# Patient Record
Sex: Female | Born: 1945 | Race: Black or African American | Hispanic: No | Smoking: Current every day smoker
Health system: Southern US, Community
[De-identification: ages and names within clinical notes are randomized; demographics above are authoritative.]

## PROBLEM LIST (undated history)

## (undated) DIAGNOSIS — M778 Other enthesopathies, not elsewhere classified: Secondary | ICD-10-CM

## (undated) DIAGNOSIS — K219 Gastro-esophageal reflux disease without esophagitis: Secondary | ICD-10-CM

## (undated) DIAGNOSIS — N200 Calculus of kidney: Secondary | ICD-10-CM

## (undated) HISTORY — DX: Gastro-esophageal reflux disease without esophagitis: K21.9

## (undated) HISTORY — DX: Other enthesopathies, not elsewhere classified: M77.8

## (undated) HISTORY — DX: Calculus of kidney: N20.0

---

## 1998-09-04 ENCOUNTER — Emergency Department (HOSPITAL_COMMUNITY): Admission: EM | Admit: 1998-09-04 | Discharge: 1998-09-04 | Payer: Self-pay | Admitting: Emergency Medicine

## 2000-02-19 ENCOUNTER — Encounter: Payer: Self-pay | Admitting: Emergency Medicine

## 2000-02-19 ENCOUNTER — Emergency Department (HOSPITAL_COMMUNITY): Admission: EM | Admit: 2000-02-19 | Discharge: 2000-02-19 | Payer: Self-pay | Admitting: Emergency Medicine

## 2001-04-12 ENCOUNTER — Emergency Department (HOSPITAL_COMMUNITY): Admission: EM | Admit: 2001-04-12 | Discharge: 2001-04-12 | Payer: Self-pay | Admitting: Emergency Medicine

## 2001-04-15 ENCOUNTER — Inpatient Hospital Stay (HOSPITAL_COMMUNITY): Admission: EM | Admit: 2001-04-15 | Discharge: 2001-04-21 | Payer: Self-pay | Admitting: *Deleted

## 2001-04-15 ENCOUNTER — Encounter (INDEPENDENT_AMBULATORY_CARE_PROVIDER_SITE_OTHER): Payer: Self-pay | Admitting: Specialist

## 2001-04-17 ENCOUNTER — Encounter: Payer: Self-pay | Admitting: Emergency Medicine

## 2001-04-18 ENCOUNTER — Encounter: Payer: Self-pay | Admitting: General Surgery

## 2001-04-19 ENCOUNTER — Encounter: Payer: Self-pay | Admitting: Emergency Medicine

## 2001-08-03 ENCOUNTER — Encounter (INDEPENDENT_AMBULATORY_CARE_PROVIDER_SITE_OTHER): Payer: Self-pay | Admitting: *Deleted

## 2001-08-03 ENCOUNTER — Encounter: Payer: Self-pay | Admitting: General Surgery

## 2001-08-03 ENCOUNTER — Ambulatory Visit (HOSPITAL_COMMUNITY): Admission: RE | Admit: 2001-08-03 | Discharge: 2001-08-05 | Payer: Self-pay | Admitting: General Surgery

## 2002-07-18 ENCOUNTER — Other Ambulatory Visit: Admission: RE | Admit: 2002-07-18 | Discharge: 2002-07-18 | Payer: Self-pay | Admitting: Family Medicine

## 2002-08-05 ENCOUNTER — Encounter: Payer: Self-pay | Admitting: Family Medicine

## 2002-08-05 ENCOUNTER — Encounter: Admission: RE | Admit: 2002-08-05 | Discharge: 2002-08-05 | Payer: Self-pay | Admitting: Family Medicine

## 2003-10-27 ENCOUNTER — Other Ambulatory Visit: Admission: RE | Admit: 2003-10-27 | Discharge: 2003-10-27 | Payer: Self-pay | Admitting: Family Medicine

## 2006-03-03 ENCOUNTER — Ambulatory Visit: Payer: Self-pay | Admitting: Family Medicine

## 2006-03-10 ENCOUNTER — Ambulatory Visit: Payer: Self-pay | Admitting: Family Medicine

## 2007-05-14 ENCOUNTER — Telehealth: Payer: Self-pay | Admitting: Family Medicine

## 2008-03-10 ENCOUNTER — Ambulatory Visit: Payer: Self-pay | Admitting: Internal Medicine

## 2008-03-10 DIAGNOSIS — M65849 Other synovitis and tenosynovitis, unspecified hand: Secondary | ICD-10-CM

## 2008-03-10 DIAGNOSIS — M65839 Other synovitis and tenosynovitis, unspecified forearm: Secondary | ICD-10-CM | POA: Insufficient documentation

## 2008-05-22 ENCOUNTER — Encounter: Payer: Self-pay | Admitting: Internal Medicine

## 2008-10-23 ENCOUNTER — Ambulatory Visit: Payer: Self-pay | Admitting: Gastroenterology

## 2008-12-01 ENCOUNTER — Ambulatory Visit: Payer: Self-pay | Admitting: Gastroenterology

## 2009-09-03 ENCOUNTER — Encounter: Payer: Self-pay | Admitting: *Deleted

## 2009-10-23 ENCOUNTER — Ambulatory Visit: Payer: Self-pay | Admitting: Family Medicine

## 2009-10-23 DIAGNOSIS — Z87442 Personal history of urinary calculi: Secondary | ICD-10-CM | POA: Insufficient documentation

## 2009-10-23 DIAGNOSIS — F17200 Nicotine dependence, unspecified, uncomplicated: Secondary | ICD-10-CM | POA: Insufficient documentation

## 2009-10-23 DIAGNOSIS — K219 Gastro-esophageal reflux disease without esophagitis: Secondary | ICD-10-CM | POA: Insufficient documentation

## 2010-09-12 LAB — CONVERTED CEMR LAB
ALT: 26 units/L (ref 0–35)
AST: 24 units/L (ref 0–37)
Albumin: 4.1 g/dL (ref 3.5–5.2)
Alkaline Phosphatase: 97 units/L (ref 39–117)
BUN: 11 mg/dL (ref 6–23)
Basophils Absolute: 0.1 10*3/uL (ref 0.0–0.1)
Basophils Relative: 1.6 % (ref 0.0–3.0)
Bilirubin Urine: NEGATIVE
Bilirubin, Direct: 0.1 mg/dL (ref 0.0–0.3)
CO2: 29 meq/L (ref 19–32)
Calcium: 8.9 mg/dL (ref 8.4–10.5)
Chloride: 108 meq/L (ref 96–112)
Cholesterol: 150 mg/dL (ref 0–200)
Creatinine, Ser: 1 mg/dL (ref 0.4–1.2)
Eosinophils Absolute: 0.2 10*3/uL (ref 0.0–0.7)
Eosinophils Relative: 2.6 % (ref 0.0–5.0)
GFR calc non Af Amer: 72.27 mL/min (ref >60)
Glucose, Bld: 93 mg/dL (ref 70–99)
Glucose, Urine, Semiquant: NEGATIVE
HCT: 36.6 % (ref 36.0–46.0)
HDL: 51 mg/dL (ref >39.00)
Hemoglobin: 12 g/dL (ref 12.0–15.0)
Ketones, urine, test strip: NEGATIVE
LDL Cholesterol: 77 mg/dL (ref 0–99)
Lymphocytes Relative: 57.6 % — ABNORMAL HIGH (ref 12.0–46.0)
Lymphs Abs: 3.9 10*3/uL (ref 0.7–4.0)
MCHC: 32.9 g/dL (ref 30.0–36.0)
MCV: 101.8 fL — ABNORMAL HIGH (ref 78.0–100.0)
Monocytes Absolute: 0.5 10*3/uL (ref 0.1–1.0)
Monocytes Relative: 8 % (ref 3.0–12.0)
Neutro Abs: 2 10*3/uL (ref 1.4–7.7)
Neutrophils Relative %: 30.2 % — ABNORMAL LOW (ref 43.0–77.0)
Nitrite: NEGATIVE
Platelets: 209 10*3/uL (ref 150.0–400.0)
Potassium: 3.7 meq/L (ref 3.5–5.1)
Protein, U semiquant: NEGATIVE
RBC: 3.59 M/uL — ABNORMAL LOW (ref 3.87–5.11)
RDW: 12.7 % (ref 11.5–14.6)
Sodium: 142 meq/L (ref 135–145)
Specific Gravity, Urine: 1.025
TSH: 1.55 microintl units/mL (ref 0.35–5.50)
Total Bilirubin: 0.4 mg/dL (ref 0.3–1.2)
Total CHOL/HDL Ratio: 3
Total Protein: 7.3 g/dL (ref 6.0–8.3)
Triglycerides: 110 mg/dL (ref 0.0–149.0)
Urobilinogen, UA: 0.2
VLDL: 22 mg/dL (ref 0.0–40.0)
WBC Urine, dipstick: NEGATIVE
WBC: 6.7 10*3/uL (ref 4.5–10.5)
pH: 6

## 2010-09-14 NOTE — Assessment & Plan Note (Signed)
Summary: PT RE-EST // RS----PT [facility] // RS   Vital Signs:  Patient profile:   65 year old female Menstrual status:  postmenopausal Height:      64 inches Weight:      123 pounds BMI:     21.19 Temp:     98.7 degrees F oral BP sitting:   150 / 80  (left arm) Cuff size:   regular  Vitals Entered By: Kern Reap CMA Duncan Dull) (October 23, 2009 2:04 PM)  Reason for Visit re establish and sinus pressure with headache     Menstrual Status postmenopausal   History of Present Illness: Erika Tucker is a 65 year old single female, who comes in today as a new patient for physical evaluation.  She has a history of reflux esophagitis, for which he takes Nexium 40 mg daily.  Will switch to Prilosec.  She gets routine eye care, and she has dentures, does BSE monthly, annual mammography, colonoscopy, October 2010.  Tetanus was two 2003  she continues to smoke..I  insists that she start a smoking cessation program.    She had a Pap smear by her GYN doctor Clearance Coots  Preventive Screening-Counseling & Management  Alcohol-Tobacco     Smoking Status: current     Packs/Day: 0.5  Caffeine-Diet-Exercise     Does Patient Exercise: yes      Drug Use:  no.    Allergies: 1)  ! Acetyl Salicylic Acid (Aspirin)  Past History:  Past Medical History: Nephrolithiasis, hx of GERD  Family History: Reviewed history and no changes required. Family History of CAD Female 1st degree relative <60 Family History of CAD Female 1st degree relative <50 Family History Diabetes 1st degree relative Family History Hypertension  Social History: Reviewed history and no changes required. 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 No apparent alopecia or balding. Eyes:  No corneal or conjunctival inflammation noted. EOMI. Perrla. Funduscopic exam benign, without hemorrhages, exudates or papilledema. Vision grossly normal. Ears:  External ear exam shows no significant lesions or deformities.  Otoscopic examination reveals clear canals, tympanic membranes are intact bilaterally without bulging, retraction, inflammation or discharge. Hearing is grossly normal bilaterally. Nose:  External nasal examination shows no deformity or inflammation. Nasal mucosa are pink and moist without lesions or exudates. Mouth:  Oral mucosa and oropharynx without lesions or exudates.  Teeth in good repair. Neck:  No deformities, masses, or tenderness noted. Chest Wall:  No deformities, masses, or tenderness noted. Breasts:  No mass, nodules, thickening, tenderness, bulging, retraction, inflamation, nipple discharge or skin changes noted.   Lungs:  decreased breath sounds bilaterally, consistent with COPD Heart:  Normal rate and regular rhythm. S1 and S2 normal without gallop, murmur, click, rub or other extra sounds. Abdomen:  Bowel sounds positive,abdomen soft and non-tender without masses, organomegaly or hernias noted. Msk:  No deformity or scoliosis noted of thoracic or lumbar spine.   Pulses:  R and L carotid,radial,femoral,dorsalis pedis and posterior tibial pulses are full and equal bilaterally Extremities:  No clubbing, cyanosis, edema, or deformity noted with normal full range of motion of all joints.   Neurologic:  No cranial nerve deficits noted. Station and gait are normal. Plantar reflexes are down-going bilaterally. DTRs are symmetrical  throughout. Sensory, motor and coordinative functions appear intact. Skin:  Intact without suspicious lesions or rashes Cervical Nodes:  No lymphadenopathy noted Axillary Nodes:  No palpable lymphadenopathy Inguinal Nodes:   No significant adenopathy Psych:  Cognition and judgment appear intact. Alert and cooperative with normal attention span and concentration. No apparent delusions, illusions, hallucinations   Problems:  Medical Problems Added: 1)  Dx of Physical Examination  (ICD-V70.0) 2)  Dx of Tobacco Use  (ICD-305.1) 3)  Dx of Family History Diabetes 1st Degree Relative  (ICD-V18.0) 4)  Dx of Family History of Cad Female 1st Degree Relative <50  (ICD-V17.3) 5)  Dx of Family History of Cad Female 1st Degree Relative <60  (ICD-V16.49) 6)  Dx of Gerd  (ICD-530.81) 7)  Dx of Nephrolithiasis, Hx of  (ICD-V13.01)  Impression & Recommendations:  Problem # 1:  TOBACCO USE (ICD-305.1) Assessment Unchanged  Orders: Venipuncture (16109) TLB-Lipid Panel (80061-LIPID) TLB-BMP (Basic Metabolic Panel-BMET) (80048-METABOL) TLB-CBC Platelet - w/Differential (85025-CBCD) TLB-Hepatic/Liver Function Pnl (80076-HEPATIC) TLB-TSH (Thyroid Stimulating Hormone) (60454-UJW) Prescription Created Electronically [id number]) UA Dipstick w/o Micro (automated)  (81003) T-2 View CXR (71020TC)  Her updated medication list for this problem includes:    Chantix Starting Month Pak 0.5 Mg X 11 & 1 Mg X 42 Tabs (Varenicline tartrate) ..... Uad  Problem # 2:  GERD (ICD-530.81) Assessment: Improved  The following medications were removed from the medication list:    Nexium 40 Mg Cpdr (Esomeprazole magnesium) .Marland Kitchen... 1 once daily Her updated medication list for this problem includes:    Prilosec 40 Mg Cpdr (Omeprazole) .Marland Kitchen... Take 1 tablet by mouth every morning  Orders: Venipuncture (78295) TLB-Lipid Panel (80061-LIPID) TLB-BMP (Basic Metabolic Panel-BMET) (80048-METABOL) TLB-CBC Platelet - w/Differential (85025-CBCD) TLB-Hepatic/Liver Function Pnl (80076-HEPATIC) TLB-TSH (Thyroid Stimulating Hormone) (62130-QMV) Prescription Created Electronically [phone]) UA Dipstick w/o Micro (automated)  (81003)  Problem # 3:  Preventive  Health Care (ICD-V70.0) Assessment: Unchanged  Complete Medication List: 1)  Prilosec 40 Mg Cpdr (Omeprazole) .... Take 1 tablet by mouth every morning 2)  Chantix Starting Month Pak 0.5 Mg X 11 & 1 Mg X 42 Tabs (Varenicline tartrate) .... Uad  Patient Instructions: 1)  you may take the over-the-counter or  the prescription, Prilosec, one daily.  When you stop smoking. u  probably will not need this medication 2)  begin the chantix as directed, and return in two weeks for follow-up. 3)  Go to the main office for a chest x-ray now.  4)  Schedule your mammogram. 5)  Schedule a colonoscopy/sigmoidoscopy to help detect colon cancer. 6)  Take calcium +Vitamin D daily. 7)  Take an Aspirin every day. Prescriptions: CHANTIX STARTING MONTH PAK 0.5 MG X 11 & 1 MG X 42 TABS (VARENICLINE TARTRATE) UAD  #1 x 0   Entered and Authorized by:   Roderick Pee MD   Signed by:   Roderick Pee MD on 10/23/2009   Method used:   Electronically to        [facility]  [address] [phone][facility] (retail)       [address]       [place], [place]  [id number]       Ph: [phone] or [phone]       Fax: [phone]   RxID:   [id number] PRILOSEC 40 MG CPDR (OMEPRAZOLE) Take 1 tablet by mouth every morning  #100 x 3   Entered and Authorized by:   Roderick Pee MD  Signed by:   Roderick Pee MD on 10/23/2009   Method used:   [facility]        [facility]  [address] [phone]* (retail)       [address]       [place], [place]  [id number]       Ph: [phone] or [phone]       Fax: [phone]   RxID:   [id number]    Immunization History:  Tetanus/Td Immunization History:    Tetanus/Td:  historical (08/15/2001)   Laboratory Results   Urine Tests  Date/Time Recieved: October 23, 2009 2:54 PM  Date/Time Reported: October 23, 2009 2:54 PM   Routine Urinalysis   Color: yellow Appearance: Clear Glucose: negative   (Normal  Range: Negative) Bilirubin: negative   (Normal Range: Negative) Ketone: negative   (Normal Range: Negative) Spec. Gravity: 1.025   (Normal Range: 1.003-1.035) Blood: trace-intact   (Normal Range: Negative) pH: 6.0   (Normal Range: 5.0-8.0) Protein: negative   (Normal Range: Negative) Urobilinogen: 0.2   (Normal Range: 0-1) Nitrite: negative   (Normal Range: Negative) Leukocyte Esterace: negative   (Normal Range: Negative)    Comments: Wynona Canes, CMA  October 23, 2009 2:54 PM      Appended Document: PT RE-EST // RS----PT [facility] // RS     Allergies: 1)  ! Acetyl Salicylic Acid (Aspirin)   Complete Medication List: 1)  Prilosec 40 Mg Cpdr (Omeprazole) .... Take 1 tablet by mouth every morning 2)  Chantix Starting Month Pak 0.5 Mg X 11 & 1 Mg X 42 Tabs (Varenicline tartrate) .... Uad  Other Orders: EKG w/ Interpretation (93000)

## 2010-09-14 NOTE — Miscellaneous (Signed)
Summary: mammogram  Clinical Lists Changes  Observations: Added new observation of MAMMO DUE: 08/2010 (09/03/2009 15:23) Added new observation of MAMMOGRAM: normal (08/20/2009 15:24)      Preventive Care Screening  Mammogram:    Date:  08/20/2009    Next Due:  08/2010    Results:  normal

## 2010-11-09 ENCOUNTER — Ambulatory Visit (INDEPENDENT_AMBULATORY_CARE_PROVIDER_SITE_OTHER)
Admission: RE | Admit: 2010-11-09 | Discharge: 2010-11-09 | Disposition: A | Discharge status: Home / Self Care | Payer: BC Managed Care – PPO | Source: Ambulatory Visit

## 2010-11-09 ENCOUNTER — Other Ambulatory Visit: Payer: Self-pay | Admitting: Family Medicine

## 2010-11-09 ENCOUNTER — Encounter: Payer: Self-pay | Admitting: Family Medicine

## 2010-11-09 ENCOUNTER — Ambulatory Visit (INDEPENDENT_AMBULATORY_CARE_PROVIDER_SITE_OTHER): Payer: BC Managed Care – PPO | Admitting: Family Medicine

## 2010-11-09 VITALS — BP 110/80 | Temp 98.5°F | Ht 66.0 in | Wt 121.0 lb

## 2010-11-09 DIAGNOSIS — R1031 Right lower quadrant pain: Secondary | ICD-10-CM

## 2010-11-09 DIAGNOSIS — M545 Low back pain, unspecified: Secondary | ICD-10-CM

## 2010-11-09 DIAGNOSIS — Z87442 Personal history of urinary calculi: Secondary | ICD-10-CM

## 2010-11-09 LAB — POCT URINALYSIS DIPSTICK
Bilirubin, UA: NEGATIVE
Glucose, UA: NEGATIVE
Ketones, UA: NEGATIVE
Nitrite, UA: NEGATIVE
Protein, UA: NEGATIVE
Spec Grav, UA: 1.015
Urobilinogen, UA: NEGATIVE
pH, UA: 6

## 2010-11-09 MED ORDER — ONDANSETRON HCL 4 MG PO TABS: ORAL_TABLET | ORAL | Status: DC

## 2010-11-09 MED ORDER — HYDROCODONE-ACETAMINOPHEN 7.5-750 MG PO TABS: 1.0000 | ORAL_TABLET | Freq: Four times a day (QID) | ORAL | Status: AC | PRN

## 2010-11-09 NOTE — Progress Notes (Signed)
Addended by: Kern Reap on: 11/09/2010 02:09 PM   Modules accepted: Orders

## 2010-11-09 NOTE — Patient Instructions (Signed)
We will set her up for a CT scan to see if you have a kidney stone.  Rest at home.  Drink lots of water.  Stop the Flagyl.  Vicodin one half to one tablet every 4 hours as needed for severe pain.  Return tomorrow for follow-up

## 2010-11-09 NOTE — Progress Notes (Signed)
  Subjective:    Patient ID: Erika Tucker, female    DOB: [birth year], 65 y.o.   MRN: [id number]  Erika Tucker Is a 65 year old female, smoker, who comes in today for evaluation of right lower quadrant abdominal pain and nausea for 5 days.  She states last Friday she woke and felt very nauseated and went to an urgent care.  She was given Flagyl 500 mg twice daily and was told she had a vaginal infection.  Since that time.  She is no better.  She continues to have nausea, inability to eat, and now for the past 4 days has had right lower quadrant abdominal pain.  She's concerned she might have a kidney stone.  She was hospitalized 10 years ago and had surgery because of a impacted stone.  Her hospital stay with a week long.  No fever, chills, nausea, vomiting, or diarrhea.  She describes a right lower quadrant pain is sometimes sharp sometimes dull lasts for a few seconds and occurs about 45 times a day.  She states is a two on a scale of one to 10 however, she states is similar to the kidney stone pain that she had 10 years ago.  Review of Systems General GI, genitourinary review of systems otherwise negative    Objective:   Physical Exam    Well-developed well-nourished, female in no acute distress.  Examination of the abdomen shows the abdomen is soft.  The bowel sounds are normal.  There is no tenderness.  Pelvic examination.  Next, an attentive, within normal limits.  Vaginal vault was normal.  Uterus and left ovary normal.  There is tenderness in the right side.  No rebound.  Rectal normal stool guaiac-negative.......... Colonoscopy last year.  Normal    Assessment & Plan:  Right lower quadrant abdominal pain, unknown etiology, however, she does have 1+ hematuria was set up for CT scan to rule out kidney stones

## 2010-11-16 ENCOUNTER — Encounter: Payer: Self-pay | Admitting: Family Medicine

## 2010-11-16 ENCOUNTER — Ambulatory Visit (INDEPENDENT_AMBULATORY_CARE_PROVIDER_SITE_OTHER): Payer: BC Managed Care – PPO | Admitting: Family Medicine

## 2010-11-16 VITALS — BP 120/80 | Temp 98.4°F | Wt 112.0 lb

## 2010-11-16 DIAGNOSIS — Z87442 Personal history of urinary calculi: Secondary | ICD-10-CM

## 2010-11-16 DIAGNOSIS — N059 Unspecified nephritic syndrome with unspecified morphologic changes: Secondary | ICD-10-CM

## 2010-11-16 LAB — POCT URINALYSIS DIPSTICK
Glucose, UA: NEGATIVE
Ketones, UA: NEGATIVE
Spec Grav, UA: 1.015

## 2010-11-16 NOTE — Progress Notes (Signed)
  Subjective:    Patient ID: Erika Tucker, female    DOB: 01-Nov-1945, 65 y.o.   MRN: [id number]  HPI Erika Tucker is a 65 year old female, who comes back today for follow-up of kidney stones.  We saw her last week with abdominal pain.  Scan shows some one to 3 mm stones.  She brings in one that she passed, however, she's having some occasional pain, however, does not require any pain medication.  Urinalysis shows a small amount of blood.   Review of Systems    General and neurologic review of systems otherwise negative, except she's been drinking a lot of tea Objective:   Physical Exam    Well-developed well-nourished, female, in no acute distress.  Examination back is normal.  Abdomen is normal    Assessment & Plan:  Kidney stones, passing spontaneously,,,,,,,,,,, drink lots of water.  Return p.r.n.

## 2010-12-09 ENCOUNTER — Other Ambulatory Visit: Payer: Self-pay | Admitting: Family Medicine

## 2010-12-31 NOTE — H&P (Signed)
[place]  Patient:    Erika Tucker, Erika Tucker Visit Number: [id number] MRN: [id number]          Service Type: MED Location: 3000 3030 01 Attending Physician:  Roque Lias Dictated by:   Reuben Likes, M.D. Admit Date:  04/15/2001                           History and Physical  CHIEF COMPLAINT:  "Vomiting and diarrhea."  HISTORY OF PRESENT ILLNESS:  The patient is a 65 year old female who has had a six-day history of crampy bilateral lower abdominal pain, nausea, vomiting of all p.o. intake, diarrhea with up to 10 loose stools per day without bloody mucus, chills.  She has felt feverish and has had headaches.  She went to [facility] Emergency Room the day after the symptoms began and was diagnosed as having viral gastroenteritis.  She was given some IV fluids and treated at home with Phenergan suppositories and NuLev, but feels no better today.  The patient states she ate at [employer] the day before her symptoms began and had chicken livers, rice, and gravy.  No other suspicious exposures.  PAST MEDICAL HISTORY:  Surgeries:  None.  Hospitalizations:  None.  Other illnesses: 1. She has had reflux esophagitis for several years. 2. She saw Dr. Elsie Lincoln for chest pain about a year ago.  She had a negative    stress EKG. 3. She sees Dr. Katrinka Blazing for her Paps and pelvics.  MEDICATIONS: 1. Vivelle-Dot 0.075 once a week. 2. Prevacid 30 mg once a day.  ALLERGIES:  She is intolerant to ASPIRIN, which causes GI upset.  FAMILY HISTORY:  Her mother has diabetes and hypertension.  Her father died at age 9 of an MI.  She has two sisters and one brother in good health and two daughters and one son in good health.  SOCIAL HISTORY:  She is divorced.  She lives with her mother in a split level home that has eight steps each way.  She has two daughters who live in town who are quite involved in her life.  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.  She smokes a pack of cigarettes a day, although she would like to quit.  She has never used alcohol.  REVIEW OF SYSTEMS:  She denies any other systemic, skin, eye, ENT, respiratory, cardiovascular, GI, GU, musculoskeletal, or neurological complaints.  She thinks she might have lost some weight recently and thinks she may be down to 98 pounds with her usual weight being perhaps as high as 120.  PHYSICAL EXAMINATION:  VITAL SIGNS:  Blood pressure 103/65, pulse 135 and regular, respirations 20, temperature 98.1.  GENERAL:  She appears alert and in no distress.  SKIN:  Warm and dry, no rash.  HEENT:  Eyes:  Pupils equal, round, and reactive to light.  Full EOMs.  Fundi benign.  She has bilateral cataracts.  Sclerae nonicteric.  ENT:  Canals and TMs normal.  Mucous membranes are dry.  No intraoral lesions.  Pharynx clear.  NECK:  Supple.  No adenopathy, JVD, or bruit.  Thyroid normal.  LUNGS:  Clear to auscultation and percussion.  HEART:  Regular rhythm, no gallop or murmur.  BREASTS:  No masses or tenderness.  ABDOMEN:  Soft and flat.  There is tenderness to palpation in the right upper quadrant and the lower abdomen bilaterally with guarding.  There is no organomegaly  or mass.  Bowel sounds are hyperactive.  RECTAL:  No masses.  Stool is heme-negative.  EXTREMITIES:  No edema.  Pulses are weak and thready.  NEUROLOGIC:  Alert, oriented x 3.  Speech is clear and appropriate.  No extremity weakness or tremor.  DTRs 2+ and symmetrical, Babinskis downgoing. Cranial nerves intact.  ADMITTING IMPRESSION: 1. Gastroenteritis, possibly viral or bacterial. 2. History of gastroesophageal reflux disease.  PLAN: 1. Stools for culture, wbcs, Giardia antigen, and Cryptosporidium antigen. 2. IV fluids. 3. IV Cipro. Dictated by:   Reuben Likes, M.D. Attending Physician:  Roque Lias DD:  04/15/01 TD:  04/15/01 Job: [id number]

## 2010-12-31 NOTE — Discharge Summary (Signed)
[place]  Patient:    Erika Tucker, Erika Tucker Visit Number: [id number] MRN: [id number]          Service Type: MED Location: 3000 3030 01 Attending Physician:  Roque Lias Dictated by:   Reuben Likes, M.D. Admit Date:  04/15/2001 Discharge Date: 04/21/2001                             Discharge Summary  HISTORY OF PRESENT ILLNESS:  The patient is a 65 year old female with a six-day history of crampy bilateral lower abdominal pain, nausea, and vomiting of all p.o. intake, diarrhea with up to 10 lose stools per day without blood or mucus, and chills.  The patient has felt feverish and has had some headaches.  She went to [facility] Emergency Room the day after the symptoms began and she was diagnosed as having viral gastroenteritis.  She was given some IV fluids and treated at home Phenergan suppositories and NuLev but feels no better today.  PHYSICAL EXAMINATION:  VITAL SIGNS:  Blood pressure 103/65, pulse 135 and regular, respirations 20, temperature 98.1.  GENERAL:  The patient appeared alert and in no distress.  SKIN:  Her skin was warm and dry.  HEENT:  PERRL.  Fundi benign.  Mucus membranes were dry.  No intraoral lesions.  Pharynx clear.  NECK:  Supple.  No adenopathy.  LUNGS:  Clear.  CARDIOVASCULAR:  Regular without gallop or murmur.  ABDOMEN:  Soft and flat.  There was tenderness to palpation in the right upper quadrant and in the lower abdomen bilaterally with guarding.  No organomegaly or mass.  Bowel sounds were hyperactive.  RECTAL:  No masses.  Stool was heme negative.  EXTREMITIES:  She had no pedal edema.  Pulses were weak and thready.  NEUROLOGICAL:  Within normal limits.  ADMITTING DIAGNOSES: 1. Gastroenteritis, possibly viral or bacterial. 2. History of gastroesophageal reflux disease.  LABORATORY DATA:  EKG showed normal sinus rhythm, rightward axis, low voltage QRS.  Abdominal ultrasound  revealed moderate right hydronephrosis representing obstruction.  There were two 1 cm nonobstructing right renal calculi and she had gallstones and gallbladder sludge without evidence of cholecystitis.  CT of the abdomen revealed moderate to severe right ureteral nephrosis.  No obstructing stone and gallstones.  Her hepatobiliary scan was negative.  On admission, hemoglobin was 12.7, white count 6400, platelet count was a little low at 81,000.  This came up to 232,000 after five days in the hospital.  Her initial blood chemistries showed a sodium of 141, potassium of 2.9 and this came up to the normal range after the second hospital day. Chloride was 110, CO2 20, glucose 99, BUN was 30, and this came down to the normal range by the third hospital day.  Creatinine was 2.4 and this normalized by the fourth hospital day.  Her albumin was 2.6.  AST and ALT were normal but alkaline phosphatase was elevated at 445 and total bilirubin was 2.0.  Her amylase was 159, lipase was 47.  Blood culture was positive showing Citrobacter koseri, which was resistant to ampicillin but sensitive to all other antibiotics tested.  A catheter of her right renal pelvis showed no growth; however, she had already been on antibiotics when this was done.  HOSPITAL COURSE:  The patient was hospitalized on the 3000 floor.  Vital signs were obtained every four hours with orthostatic vital signs once a day.  She was  allowed to be up ad-lib, given clear liquid diet, and d-5-1/2 normal saline which came to the equivalence of KCl per liter at 150 cc an hour.  She was begun on Cipro 400 mg IV every 12 hours, Pepcid 20 mg IV every 12 hours.  She was given Morphine for pain and Phenergan for nausea and vomiting and a Habitrol patch was placed since she was a smoker.  The following day, she still had some lower abdominal pain and headache but no further vomiting and diarrhea and she was tolerating clear liquids.   Potassium was replaced and her upper abdominal ultrasound was ordered.  This did show a hydronephrosis, gallstones, and therefore consults were obtained with Dr. Verl Dicker and Dr. Angelia Mould. [place].  Dr. Verl Dicker performed a cystoscopy and placed a stent in the right kidney.  No stone or tumor was found.  It was felt that the urethral narrowing was just due to scarring.  Dr. Angelia Mould. Derrell Lolling did not feel that she had acute cholecystitis but did feel that she would need to have a cholecystectomy electively in the future.  As of April 20, 2001, she felt better.  Since she had her cystoscopy and stent placement with less pain and nausea, she was afebrile.  She was switched to p.o. Cipro and her diet was advanced.  As of April 21, 2001, she still had some right flank pain, about the same as the day before admission.  She did not have much of an appetite but no nausea or vomiting.  She was constipated the day before and took some laxatives and she had some diarrhea.  Her mouth and throat were somewhat sore but she was anxious to go home.  As she was afebrile, her lungs were clear.  Cardiac rhythm was regular without murmur.  Abdomen showed right and left flank tenderness but bowel sounds were active.  Her white count was 6000.  Her alkaline phosphatase was 281.  Her amylase was 251, lipase was 88.  It was felt she could be discharge on the date in improved condition to be followed up by myself, Dr. Verl Dicker, and Dr. Angelia Mould. Derrell Lolling.  FINAL DIAGNOSES: 1. Urethral stenosis and right hydronephrosis. 2. Urinary tract infection with septicemia. 3. Gallstones. 4. Mild pancreatitis.  DISCHARGE MEDICATIONS: 1. Cipro 500 mg b.i.d. 2. Perdiem plus 1 b.i.d. as needed. 3. Prevacid 30 mg q.d. 4. Vivelle with once a week. 5. Nicotine patch 21 mg q.d.  ACTIVITY:  She can return to work around May 12, 2001.  DIET:  Low fat diet.  FOLLOW-UP:   She is to follow up with me in a week.  See Dr. Angelia Mould. Derrell Lolling  and also Dr. Verl Dicker in follow-up as well. Dictated by:   Reuben Likes, M.D. Attending Physician:  Roque Lias DD:  05/05/01 TD:  05/05/01 Job: [id number]

## 2010-12-31 NOTE — Op Note (Signed)
[place]. [facility]  Patient:    Erika Tucker, Erika Tucker Visit Number: [id number] MRN: [id number]          Service Type: Attending:  Angelia Mould. Derrell Lolling, M.D. Dictated by:   Angelia Mould. Derrell Lolling, M.D. Proc. Date: 08/03/01   CC:         Reuben Likes, M.D.                           Operative Report  PREOPERATIVE DIAGNOSIS:  Chronic cholecystitis with cholelithiasis.  POSTOPERATIVE DIAGNOSIS:  Chronic cholecystitis with cholelithiasis.  PROCEDURE:  Laparoscopic cholecystectomy with intraoperative cholangiogram.  SURGEON:  Angelia Mould. Derrell Lolling, M.D.  FIRST ASSISTANT:  Abigail Miyamoto, M.D.  OPERATIVE INDICATIONS:  This is a 65 year old black female who was hospitalized in September of this year with right-sided abdominal pain, diarrhea, nausea, and vomiting.  She was found to have an obstructed right kidney secondary to a kidney stone.  She was stented and the stone passed. The stent has been removed.  Also during that hospitalization she had elevated serum amylase and elevated alkaline phosphatase.  She has a long history of intermittent episodes of mid- and upper abdominal pain after meals associated with nausea.  Ultrasound shows multiple gallstones and a normal common bile duct.  She has been relatively asymptomatic for the past month, but her alkaline phosphatase remains elevated.  She was brought to the operating room electively for cholecystectomy.  OPERATIVE FINDINGS:  The gallbladder was chronically inflamed and was discolored and had extensive adhesions to it.  The anatomy of the cystic duct, cystic artery, and common bile duct were conventional.  The cholangiogram was normal, showing normal intrahepatic and extrahepatic bile ducts, no filling defect, and prompt flow of contrast into the duodenum.  The liver, stomach, duodenum, small intestine, large intestine, and peritoneal surfaces were normal to inspection.  DESCRIPTION OF PROCEDURE:  Following the  induction of general endotracheal anesthesia, the patients abdomen was prepped and draped in a sterile fashion. Marcaine 0.5% with epinephrine was used for local infiltration anesthetic.  A transverse incision was made at the lower rim of the umbilicus.  The fascia was incised in the midline and the abdominal cavity entered under direct vision.  A 10 mm Hasson trocar was inserted and secured with a pursestring suture of 0 Vicryl.  Pneumoperitoneum was created.  Video camera was inserted with visualization and findings as described above.  A 10 mm trocar was placed into the subxiphoid region and two 5 mm trocars placed in the right upper quadrant.  The gallbladder fundus was elevated.  Adhesions were carefully taken down.  The duodenum was adherent to the infundibulum of the gallbladder chronically, and these adhesions were taken down sharply with scissor dissection.  One tiny artery in the adhesions had to be isolated and controlled with a metal clip.  We very atraumatically dissected the duodenum off of the gallbladder, and then we were able to dissect out the cystic duct and the cystic artery.  The cystic artery was controlled with metal clips and divided as it went onto the gallbladder wall.  The cystic duct was easily isolated and secured with a metal clip close to the gallbladder.  The cholangiogram catheter was inserted.  Cholangiogram was obtained using the C-arm.  The cholangiogram was normal, showing normal intrahepatic and extrahepatic bile ducts, prompt flow of contrast into the duodenum, and no filling defect.  The cholangiogram catheter was removed.  The cystic duct  was secured with metal clips and divided.  The gallbladder was dissected from its bed with electrocautery and removed through the umbilical port.  The operative field was copiously irrigated with saline.  At the completion of the case, there was no bleeding and no bile leak whatsoever.  The irrigation fluid  was completely clear.  The trocars were removed under direct vision, and there was no bleeding from the trocar sites.  Pneumoperitoneum was released.  The fascia at the umbilicus was closed with 0 Vicryl sutures.  The skin incisions were closed with subcuticular sutures of 4-0 Vicryl and Steri-Strips.  Clean bandages were placed.  The was patient taken to the recovery room in stable condition.  The estimated blood loss was about 25 cc.  Complications:  None. Sponge, needle, and instrument counts were correct. Dictated by:   Angelia Mould. Derrell Lolling, M.D. Attending:  Angelia Mould. Derrell Lolling, M.D. DD:  08/03/01 TD:  08/04/01 Job: [id number]

## 2010-12-31 NOTE — Consult Note (Signed)
[place]. [facility]  Patient:    Erika Tucker, Erika Tucker Visit Number: [id number] MRN: [id number]          Service Type: MED Location: 3000 [id number] [id number] Attending Physician:  Roque Lias Dictated by:   Angelia Mould. Derrell Lolling, M.D. Proc. Date: 04/18/01 Admit Date:  04/15/2001   CC:         Reuben Likes, M.D.  Verl Dicker, M.D.   Consultation Report  REASON FOR CONSULTATION:  Gallstones.  HISTORY OF PRESENT ILLNESS:  This is a 65 year old black female with no prior problems with her gallbladder or her kidneys.  One week ago she developed right lower quadrant pain and shortly thereafter began having multiple loose non-bloody bowel movements.  She also developed some nausea and vomiting. After 24 to 48 hours, she went to the [facility] Emergency Room, was diagnosed as gastroenteritis, was given intravenous fluids and sent home.  She continued with diarrhea and vomiting, and was admitted here by Dr. Leslee Home on April 15, 2001, with continued vomiting and diarrhea.  Since her hospitalization, she has improved.  She feels much better.  Her vomiting and diarrhea had resolved, but she is still anorexic.  She is on intravenous Cipro because of positive blood cultures for Citrobacter.  Imaging studies had been obtained.  Ultrasound shows right hydronephrosis with a distal obstruction suspected.  It also shows gallstones, but the common bile duct is not dilated, and the gallbladder wall is not thickened.  A CT scan confirms hydronephrosis with suspected distal obstruction of uncertain etiology.  It confirms gallstone, but the gallbladder does not look dilated. I was asked to see her to evaluate her biliary tract.  PAST MEDICAL HISTORY: 1. Gastroesophageal reflux disease. 2. Cardiac workup, and had a negative stress test recently.  Otherwise, no medical or surgical problems.  CURRENT MEDICATIONS: 1. Estrogen patch. 2. Proton pump  inhibitors.  ALLERGIES:  ASPIRIN.  SOCIAL HISTORY:  She is divorced.  She lives with her mother.  She has two daughters who live in town.  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  She smokes 1 pack of cigarettes per day.  She never has used alcohol.  PHYSICAL EXAMINATION:  GENERAL:  A pleasant middle-aged woman who appears in no distress.  She is thin.  VITAL SIGNS:  Temperature is 98.1, heart rate 88, respiratory rate 20, blood pressure 127/74.  HEENT:  Sclerae clear.  Extraocular movements intact.  Oropharynx clear.  NECK:  Supple, no adenopathy, no bruits, thyroid normal.  LUNGS:  Clear to auscultation.  HEART:  Regular rate and rhythm, no murmur.  BREASTS:  No mass.  ABDOMEN:  Soft and not distended.  Bowel sounds all present.  There is a little bit of tenderness to deep palpation of the right lower quadrant, but no peritoneal signs and no mass.  The right upper quadrant is nontender, but there is some tenderness to percuss the right costal margin.  There is no costovertebral angle tenderness on either side.  RECTAL:  Not performed.  Stool heme negative per Dr. Lorenz Coaster.  EXTREMITIES:  No edema, good pulses.  LABORATORY DATA:  White blood cell count 9100 on September 1.  Total bilirubin 2.0 on September 1, down to 0.7 on September 4.  Alkaline phosphatase 445 on September 1, down to 188 on September 4.  SGOT and SGPT are normal.  Blood cultures are positive for Citrobacter koseri, sensitive to Cipro.  IMPRESSION: 1. Gallstones.  It is  unclear whether she is symptomatic from these at this    time.  There is no radiographic evidence for inflammation or obstruction. 2. Right hydronephrosis, suspect distal ureteral obstruction of uncertain    etiology. 3. I suspect her bacteremia is more likely due to the right kidney obstruction    than to the biliary tract.  She is clinically improving.  However, the    transient elevation of her liver function  tests and the bacteremia are a    concern.  I suspect that she will need a cholecystectomy at some point. 4. For now, I would recommend that we continue the intravenous antibiotics and    proceed with a hepatobiliary scan.  Also, Dr. Boston Service is going to    see her from the urologic evaluation to see whether she needs to have her    right kidney obstruction relieved acutely.  We will reconsider her    management plan once the biliary scan and the urologic opinion are    obtained. Dictated by:   Angelia Mould. Derrell Lolling, M.D. Attending Physician:  Roque Lias DD:  04/18/01 TD:  04/18/01 Job: [id number]

## 2011-03-19 ENCOUNTER — Emergency Department (HOSPITAL_COMMUNITY)
Admission: EM | Admit: 2011-03-19 | Discharge: 2011-03-19 | Disposition: A | Discharge status: Home / Self Care | Payer: BC Managed Care – PPO | Attending: Emergency Medicine | Admitting: Emergency Medicine

## 2011-03-19 DIAGNOSIS — R109 Unspecified abdominal pain: Secondary | ICD-10-CM | POA: Insufficient documentation

## 2011-03-19 DIAGNOSIS — K219 Gastro-esophageal reflux disease without esophagitis: Secondary | ICD-10-CM | POA: Insufficient documentation

## 2011-03-19 LAB — CBC
HCT: 37 % (ref 36.0–46.0)
Hemoglobin: 12.4 g/dL (ref 12.0–15.0)
MCH: 32.4 pg (ref 26.0–34.0)
MCHC: 33.5 g/dL (ref 30.0–36.0)
MCV: 96.6 fL (ref 78.0–100.0)

## 2011-03-19 LAB — DIFFERENTIAL
Basophils Relative: 0 % (ref 0–1)
Lymphocytes Relative: 22 % (ref 12–46)
Lymphs Abs: 1.5 10*3/uL (ref 0.7–4.0)
Monocytes Absolute: 0.4 10*3/uL (ref 0.1–1.0)
Monocytes Relative: 6 % (ref 3–12)
Neutro Abs: 4.8 10*3/uL (ref 1.7–7.7)

## 2011-03-19 LAB — BASIC METABOLIC PANEL
CO2: 24 mEq/L (ref 19–32)
Calcium: 9.6 mg/dL (ref 8.4–10.5)
Chloride: 108 mEq/L (ref 96–112)
Creatinine, Ser: 0.95 mg/dL (ref 0.50–1.10)
Glucose, Bld: 110 mg/dL — ABNORMAL HIGH (ref 70–99)

## 2011-03-19 LAB — URINALYSIS, ROUTINE W REFLEX MICROSCOPIC
Bilirubin Urine: NEGATIVE
Glucose, UA: NEGATIVE mg/dL
Hgb urine dipstick: NEGATIVE
Ketones, ur: NEGATIVE mg/dL
Protein, ur: NEGATIVE mg/dL

## 2011-03-20 LAB — URINE CULTURE
Colony Count: NO GROWTH
Culture: NO GROWTH

## 2011-04-20 ENCOUNTER — Ambulatory Visit (INDEPENDENT_AMBULATORY_CARE_PROVIDER_SITE_OTHER): Payer: BC Managed Care – PPO | Admitting: Family Medicine

## 2011-04-20 ENCOUNTER — Encounter: Payer: Self-pay | Admitting: Family Medicine

## 2011-04-20 VITALS — BP 120/78 | HR 95 | Temp 98.0°F | Wt 109.0 lb

## 2011-04-20 DIAGNOSIS — R109 Unspecified abdominal pain: Secondary | ICD-10-CM

## 2011-04-20 DIAGNOSIS — R350 Frequency of micturition: Secondary | ICD-10-CM

## 2011-04-20 DIAGNOSIS — R10A1 Flank pain, right side: Secondary | ICD-10-CM

## 2011-04-20 DIAGNOSIS — K1230 Oral mucositis (ulcerative), unspecified: Secondary | ICD-10-CM

## 2011-04-20 DIAGNOSIS — K121 Other forms of stomatitis: Secondary | ICD-10-CM

## 2011-04-20 LAB — POCT URINALYSIS DIPSTICK
Glucose, UA: NEGATIVE
Nitrite, UA: NEGATIVE
Protein, UA: NEGATIVE
Urobilinogen, UA: 0.2

## 2011-04-20 MED ORDER — MAGIC MOUTHWASH: 5.0000 mL | ORAL | Status: DC

## 2011-04-20 MED ORDER — ONDANSETRON HCL 4 MG PO TABS: ORAL_TABLET | ORAL | Status: DC

## 2011-04-20 NOTE — Progress Notes (Signed)
  Subjective:    Patient ID: Erika Tucker, female    DOB: Oct 26, 1945, 65 y.o.   MRN: [id number]  HPI Here for 2 reasons. First she thinks she is passing another kidney stone. For 2 days she has had mild sharp right flank pains and some nausea. No vomiting or fever. She has had urgency to urinate but no burning. No visible blood in the urine. No change in BMs. Also for one week she has had a film in her mouth and on her tongue which causes a foul taste.    Review of Systems  Constitutional: Negative.   HENT: Negative.   Eyes: Negative.   Gastrointestinal: Positive for nausea. Negative for vomiting, abdominal pain, diarrhea, constipation, blood in stool, abdominal distention and anal bleeding.  Genitourinary: Positive for urgency and flank pain. Negative for dysuria, frequency, hematuria and difficulty urinating.       Objective:   Physical Exam  Constitutional: She appears well-developed and well-nourished.  HENT:       The tongue is covered with a whitish film   Neck: Neck supple. No thyromegaly present.  Abdominal: Soft. Bowel sounds are normal. She exhibits no distension and no mass. There is no rebound and no guarding.       Slight RLQ tenderness   Lymphadenopathy:    She has no cervical adenopathy.          Assessment & Plan:  She is probably passing a small kidney stone. Drink lots of water. She has Vicodin at home to use for pain if needed. Try Magic Mouthwash for the mouth. She will return in the pain gets worse, or if she developed vomiting or fever.

## 2011-04-22 ENCOUNTER — Telehealth: Payer: Self-pay | Admitting: Family Medicine

## 2011-04-22 NOTE — Telephone Encounter (Signed)
I did resend

## 2011-04-22 NOTE — Telephone Encounter (Signed)
Pt called and said that [facility] on [address] did not rcv script for Alum & Mag Hydroxide-Simeth (MAGIC MOUTHWASH) SOLN. Pls resend.

## 2011-10-03 ENCOUNTER — Other Ambulatory Visit: Payer: Self-pay | Admitting: Family Medicine

## 2012-04-16 ENCOUNTER — Other Ambulatory Visit: Payer: Self-pay | Admitting: Family Medicine

## 2012-04-17 ENCOUNTER — Other Ambulatory Visit: Payer: Self-pay | Admitting: Family Medicine

## 2012-05-28 ENCOUNTER — Ambulatory Visit (INDEPENDENT_AMBULATORY_CARE_PROVIDER_SITE_OTHER): Payer: BC Managed Care – PPO | Admitting: Family Medicine

## 2012-05-28 ENCOUNTER — Encounter: Payer: Self-pay | Admitting: Family Medicine

## 2012-05-28 VITALS — BP 148/80 | Temp 98.3°F | Wt 107.0 lb

## 2012-05-28 DIAGNOSIS — R1031 Right lower quadrant pain: Secondary | ICD-10-CM

## 2012-05-28 DIAGNOSIS — N200 Calculus of kidney: Secondary | ICD-10-CM

## 2012-05-28 DIAGNOSIS — Z23 Encounter for immunization: Secondary | ICD-10-CM

## 2012-05-28 LAB — POCT URINALYSIS DIPSTICK
Protein, UA: NEGATIVE
Spec Grav, UA: 1.01
Urobilinogen, UA: 0.2
pH, UA: 6

## 2012-05-28 MED ORDER — TRAMADOL HCL 50 MG PO TABS: 50.0000 mg | ORAL_TABLET | Freq: Three times a day (TID) | ORAL | Status: DC | PRN

## 2012-05-28 NOTE — Progress Notes (Signed)
  Subjective:    Patient ID: Erika Tucker, female    DOB: [birth year], 66 y.o.   MRN: [id number]  HPI Erika Tucker  is a 66 year old female smoker who comes in today with a 3-D history of right groin pain  We saw her about a year ago with similar symptoms. CT scan the abdomen shows multiple small stones. She passed them spontaneously. She's been symptom-free until Saturday when she began to have pain in her right groin. No flank pain no fever   Review of Systems    urinary tract review of systems otherwise negative Objective:   Physical Exam Well-developed thin female no acute distress examination she abdomen is negative urinalysis shows trace blood       Assessment & Plan:

## 2012-05-28 NOTE — Patient Instructions (Signed)
Drink lots of water  Strain your urine  Tramadol 50 mg,,,,,,,,,, 1/2-1 tablet 3 times daily for pain  Return in one week for followup

## 2012-06-05 ENCOUNTER — Ambulatory Visit (INDEPENDENT_AMBULATORY_CARE_PROVIDER_SITE_OTHER): Payer: BC Managed Care – PPO | Admitting: Family Medicine

## 2012-06-05 ENCOUNTER — Encounter: Payer: Self-pay | Admitting: Family Medicine

## 2012-06-05 VITALS — BP 122/82 | Temp 98.0°F | Wt 110.0 lb

## 2012-06-05 DIAGNOSIS — N200 Calculus of kidney: Secondary | ICD-10-CM

## 2012-06-05 NOTE — Progress Notes (Signed)
  Subjective:    Patient ID: Erika Tucker, female    DOB: April 03, 1946, 66 y.o.   MRN: [id number]  HPI 19 he is a 67 year old female who comes in today for followup of a kidney stone  We saw her last week with right thigh pain and hematuria. She's been straining her urine sent and has not passed a discrete stone but she says she's passed little flecks. Pain-free now  Urinalysis clear no blood   Review of Systems    urologic review of systems negative Objective:   Physical Exam Well-developed well-nourished thin female no acute distress urinalysis today normal pain-free       Assessment & Plan:  Kidney stone passed return CPX

## 2012-06-05 NOTE — Patient Instructions (Signed)
Drink lots of water  Return sometime in the next 2-3 months for general physical exam  Labs one week prior

## 2012-08-06 ENCOUNTER — Other Ambulatory Visit: Payer: BC Managed Care – PPO

## 2012-08-14 ENCOUNTER — Encounter: Payer: BC Managed Care – PPO | Admitting: Family Medicine

## 2012-08-25 ENCOUNTER — Other Ambulatory Visit: Payer: Self-pay | Admitting: Family Medicine

## 2012-08-30 IMAGING — CT CT ABD-PELV W/O CM
2 of 4 series · 17 of 46 positions shown, 19 images · non-contrast
Comparison: Report from 04/17/2001

CLINICAL DATA: Right lower quadrant abdominal pain.  Right flank
pain extending to the groin.  Remote history of renal calculi.

CT ABDOMEN AND PELVIS WITHOUT CONTRAST
TECHNIQUE: Multidetector CT imaging of the abdomen and pelvis was
performed following the standard protocol without intravenous
contrast.

[Series 2: 5mm stone study - 160 eff. mas · axial · 0.51mm/px · z∈[-407,-37]mm · 14 of 82 slices shown, 16 images]
[im 4/82  soft-tissue]
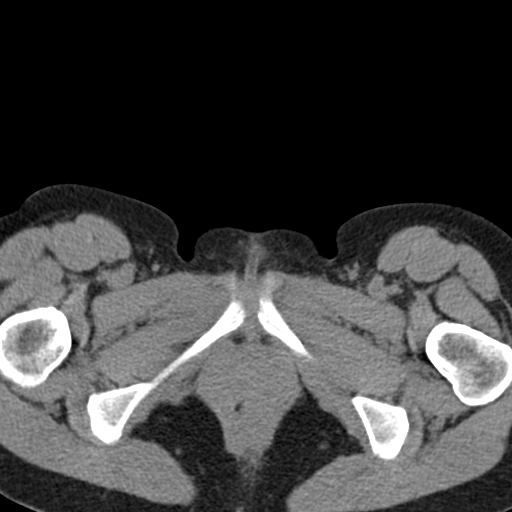
[im 4/82  bone]
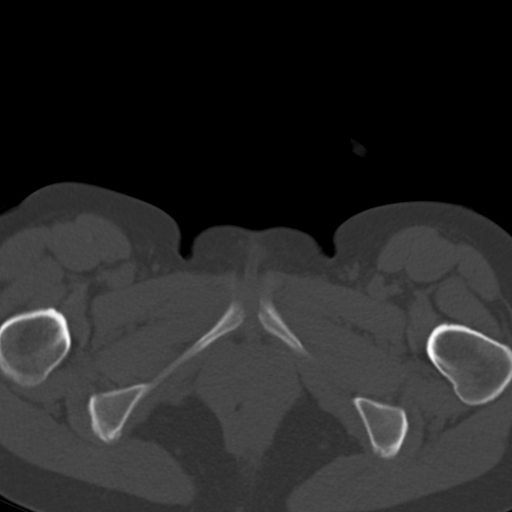
[im 11/82  soft-tissue]
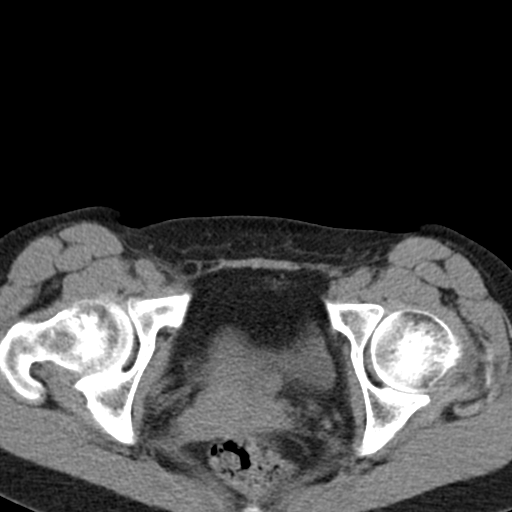
[im 17/82  soft-tissue]
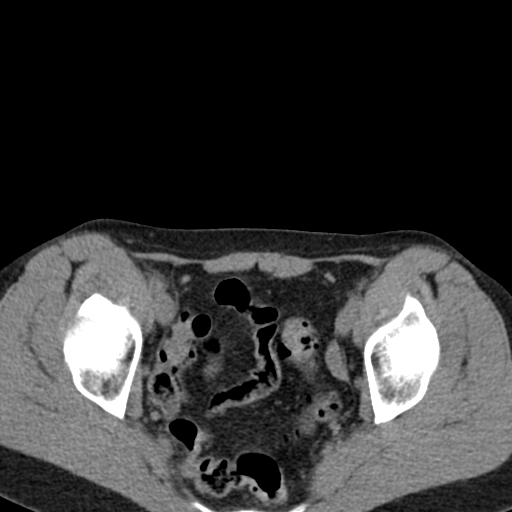
[im 21/82  soft-tissue]
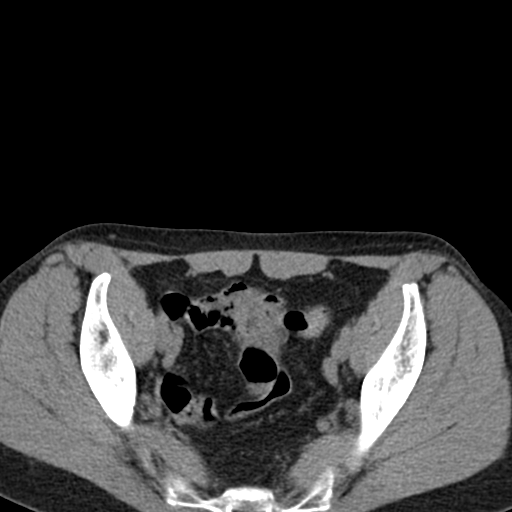
[im 28/82  soft-tissue]
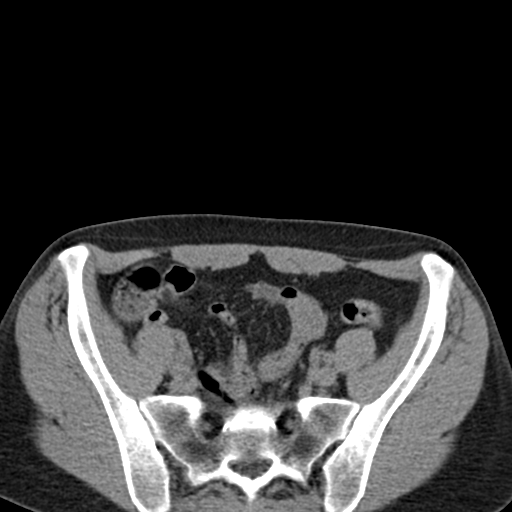
[im 34/82  soft-tissue]
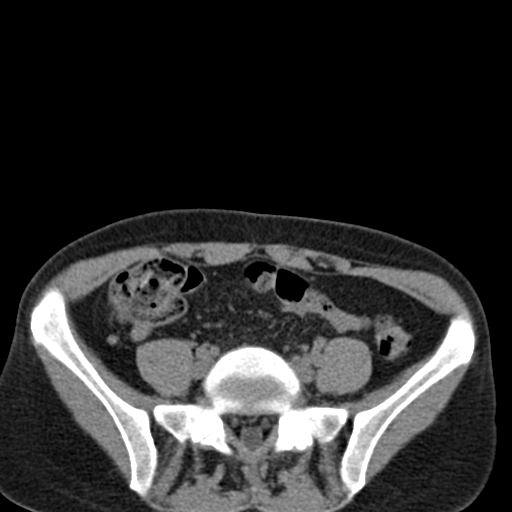
[im 38/82  soft-tissue]
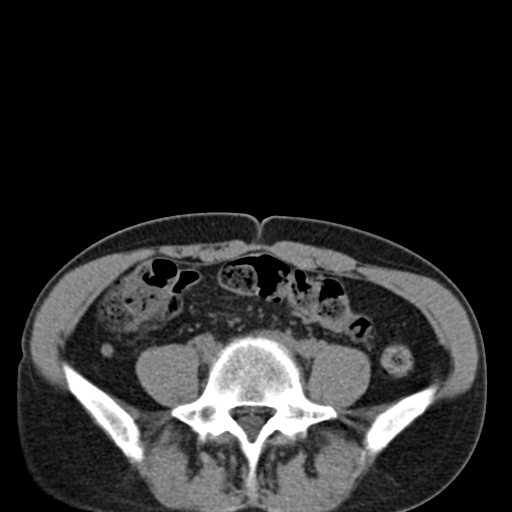
[im 44/82  soft-tissue]
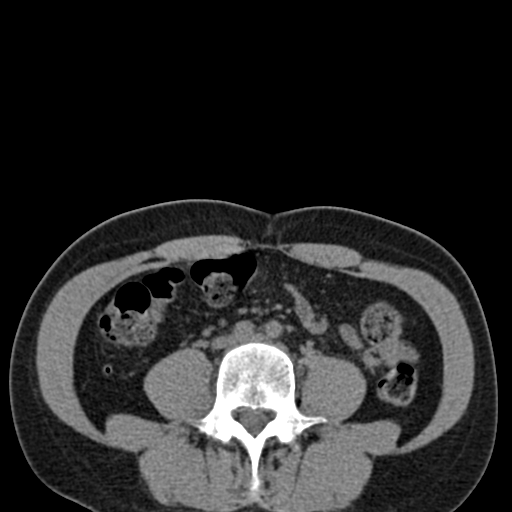
[im 48/82  soft-tissue]
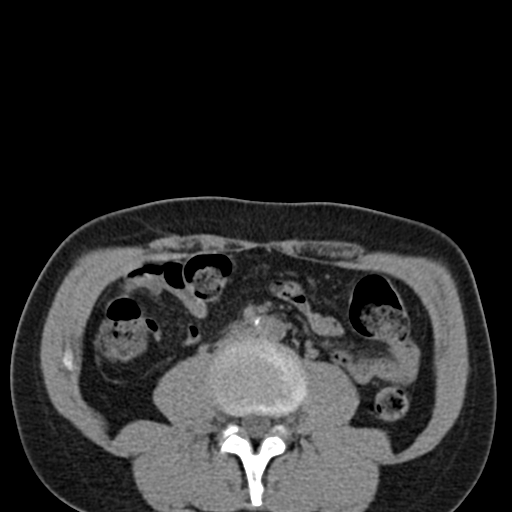
[im 48/82  bone]
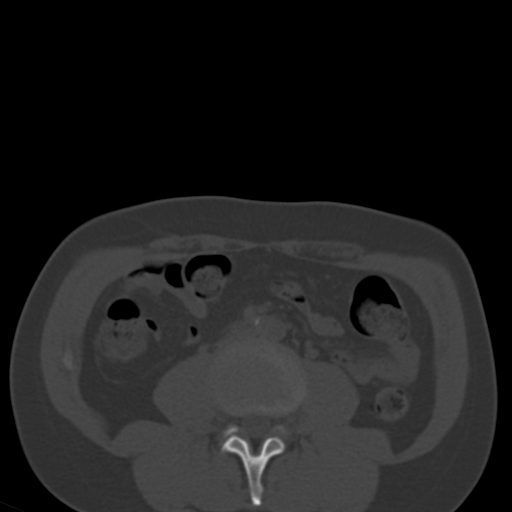
[im 55/82  soft-tissue]
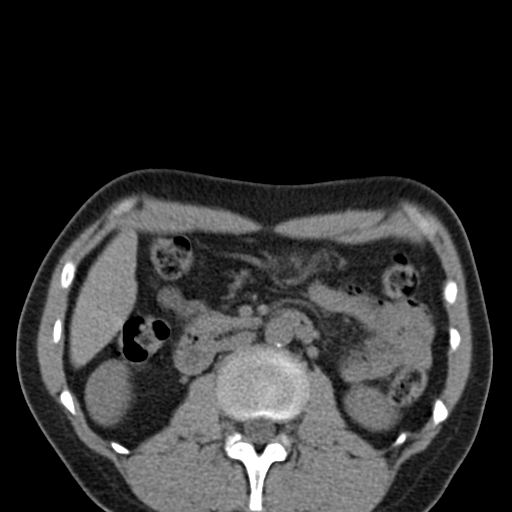
[im 61/82  soft-tissue]
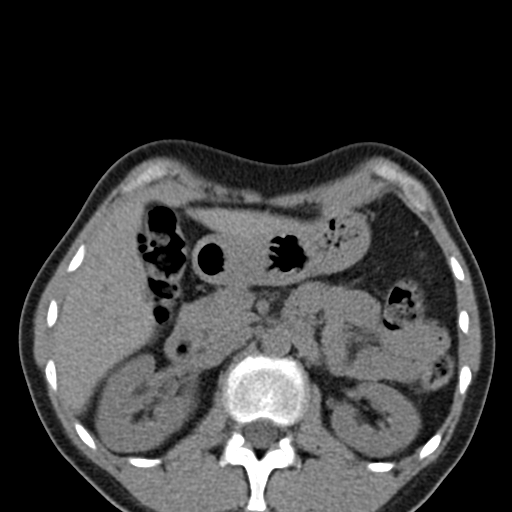
[im 65/82  soft-tissue]
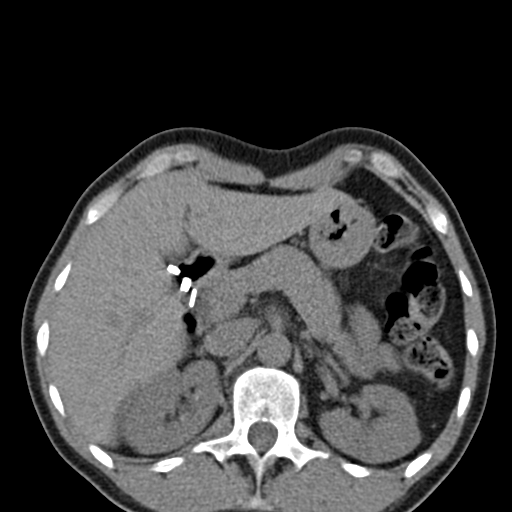
[im 71/82  soft-tissue]
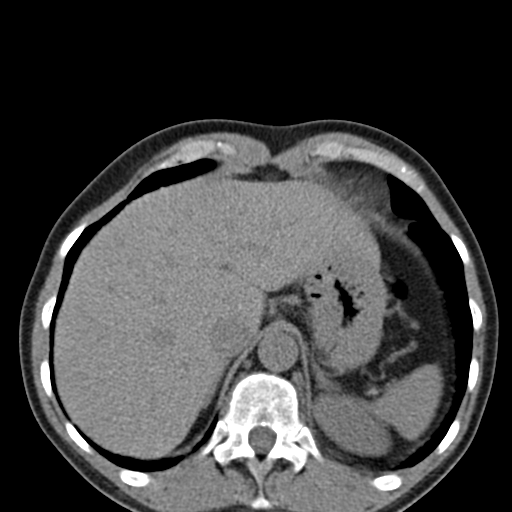
[im 78/82  soft-tissue]
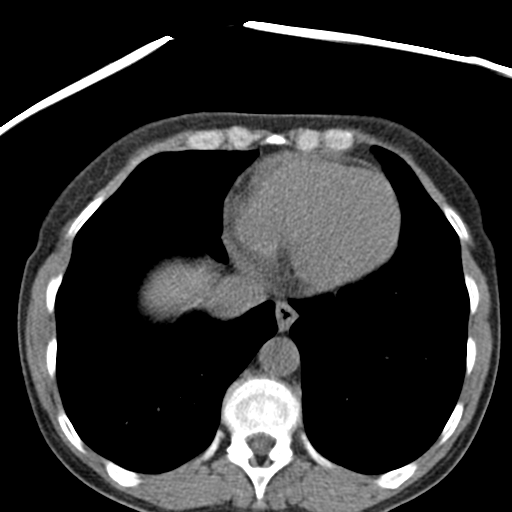

[Series 602: cor · coronal · 0.83mm/px · 3 of 76 slices shown]
[im 26/76  soft-tissue]
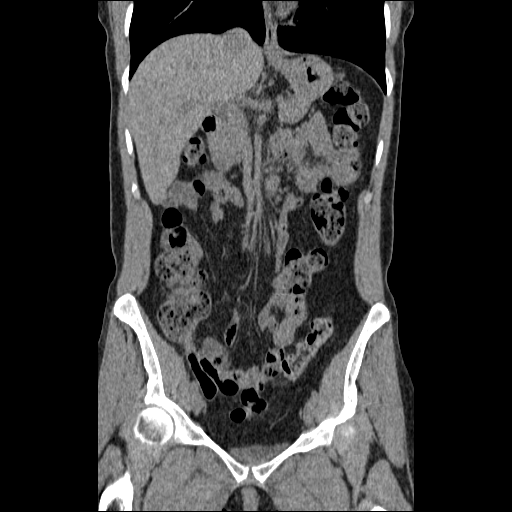
[im 34/76  soft-tissue]
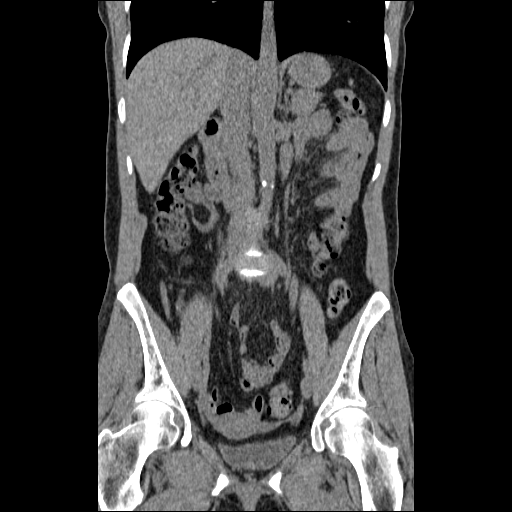
[im 42/76  soft-tissue]
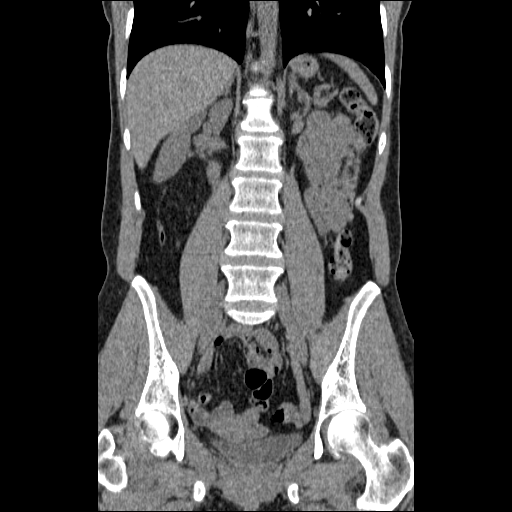

[17 of 46 positions shown; findings below may reference images not displayed]

FINDINGS: The visualized portion of the liver, spleen, pancreas,
and adrenal glands appear unremarkable in noncontrast CT
appearance.

The gallbladder is surgically absent.

Four small foci of calcification noted in the right collecting
system, adjacent to the medullary pyramids.  Although this could
represent some faint medullary nephrocalcinosis, I favor a small
renal calculus.  An index calculus in the right mid kidney measures
3 mm in diameter.

There also some very faint calcifications in the left collecting
system compatible with tiny calculi.

No hydronephrosis or current hydroureter identified.  No ureteral
calculus is seen.  The appendix appears normal.  No specific
abnormality of the terminal ileum is identified.

Uterine and adnexal contours appear within normal limits.

No pathologic retroperitoneal or porta hepatis adenopathy is
identified.

No pathologic pelvic adenopathy is identified.
IMPRESSION: 1.  Faint calcification is in the collecting systems of both
kidneys.  On the axial images, the possibility of medullary
nephrocalcinosis is raised, although some of the calcifications
appear more punctate and overall I suspect these are more likely
represent small calculi in the 1-3 mm range.
2.  No hydronephrosis, hydroureter, or ureteral calculus is
identified.
3.  The appendix appears normal.

## 2012-09-03 ENCOUNTER — Encounter: Payer: BC Managed Care – PPO | Admitting: Family Medicine

## 2012-09-10 ENCOUNTER — Telehealth: Payer: Self-pay | Admitting: Family Medicine

## 2012-09-10 NOTE — Telephone Encounter (Signed)
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

## 2012-09-10 NOTE — Telephone Encounter (Signed)
Patient Information:  Caller Name: [name]  Phone: [phone]  Patient: Erika Tucker, Erika Tucker  Gender: Female  DOB: 1945-12-27  Age: 67 Years  PCP: Kelle Darting [facility])  Office Follow Up:  Does the office need to follow up with this patient?: Yes  Instructions For The Office: Please follow up with this patient regarding possible appointment on 1/28.  Guidline is to see today due to earache.  Advised her this will require MD approval.  Thank you.   Symptoms  Reason For Call & Symptoms: Headache, runny nose with clear drainage.  Rates pain at 3 of 10, intermittent. Left earache.  See Today in Office per Sinus Pain and Congestion protocol.  Reviewed Health History In EMR: Yes  Reviewed Medications In EMR: Yes  Reviewed Allergies In EMR: Yes  Reviewed Surgeries / Procedures: Yes  Date of Onset of Symptoms: 09/08/2012  Guideline(s) Used:  Sinus Pain and Congestion  Disposition Per Guideline:   See Today in Office  Reason For Disposition Reached:   Earache  Advice Given:  For a Runny Nose With Profuse Discharge:  Nasal mucus and discharge helps to wash viruses and bacteria out of the nose and sinuses.  Blowing the nose is all that is needed.  If the skin around your nostrils gets irritated, apply a tiny amount of petroleum ointment to the nasal openings once or twice a day.  Hydration:  Drink plenty of liquids (6-8 glasses of water daily). If the air in your home is dry, use a cool mist humidifier  Expected Course:  Sinus congestion from viral upper respiratory infections (colds) usually lasts 5-10 days.  Occasionally a cold can worsen and turn into bacterial sinusitis. Clues to this are sinus symptoms lasting longer than 10 days, fever lasting longer than 3 days, and worsening pain. Bacterial sinusitis may need antibiotic treatment.  Call Back If:   You become worse.  Patient Refused Recommendation:  Patient Refused Appt, Patient Requests Appt At Later Date  States she  cannot get to office 1/27; requests to be seen on 1/28 if approved by MD

## 2012-09-10 NOTE — Telephone Encounter (Signed)
Appointment made

## 2012-09-11 ENCOUNTER — Ambulatory Visit: Payer: BC Managed Care – PPO | Admitting: Family Medicine

## 2012-09-17 ENCOUNTER — Ambulatory Visit (INDEPENDENT_AMBULATORY_CARE_PROVIDER_SITE_OTHER): Payer: BC Managed Care – PPO | Admitting: Family Medicine

## 2012-09-17 ENCOUNTER — Encounter: Payer: Self-pay | Admitting: Family Medicine

## 2012-09-17 VITALS — BP 140/90 | Temp 98.8°F | Wt 109.0 lb

## 2012-09-17 DIAGNOSIS — H60339 Swimmer's ear, unspecified ear: Secondary | ICD-10-CM

## 2012-09-17 DIAGNOSIS — H60332 Swimmer's ear, left ear: Secondary | ICD-10-CM | POA: Insufficient documentation

## 2012-09-17 DIAGNOSIS — F17200 Nicotine dependence, unspecified, uncomplicated: Secondary | ICD-10-CM

## 2012-09-17 MED ORDER — NEOMYCIN-POLYMYXIN-HC 3.5-10000-1 OT SOLN: OTIC | Status: DC

## 2012-09-17 MED ORDER — VARENICLINE TARTRATE 0.5 MG X 11 & 1 MG X 42 PO MISC: ORAL | Status: DC

## 2012-09-17 NOTE — Progress Notes (Signed)
  Subjective:    Patient ID: Erika Tucker, female    DOB: 1945/12/03, 67 y.o.   MRN: [id number]  HPI Erika Tucker is a 67 year old female who comes in today for evaluation of 2 problems  For the past week she's had pain in her left ear. She's been using a Q-tip a couple times daily.  She continues to smoke about 10 cigarettes a day. She is reluctant to take the Chantix because she's heard about the side effects. I explained to her the side effects of the medication are much less than the side effects of lung cancer heart attack and stroke   Review of Systems Review of systems otherwise negative    Objective:   Physical Exam  Well-developed well-nourished female in no acute distress right ear is normal left ear shows swelling of the ear canal consistent with a swimmer's ear        Assessment & Plan:  Left swimmer's ear,,,,,,, stop Q-tips,,,,,,, it drops each bedtime for one week  ,,,

## 2012-09-17 NOTE — Patient Instructions (Signed)
Cortisporin eardrops,,,,,,,,,,, 2 drops left ear cannot bedtime for one week  Stop using Q-tips  Begin the Chantix program for smoking cessation,,,,,,,,,, one tablet Monday Wednesday Friday for 2 weeks then one tablet daily  Return in 3 weeks for followup  Decrease your cigarette consumption by 2 per week

## 2012-10-08 ENCOUNTER — Encounter: Payer: Self-pay | Admitting: Family Medicine

## 2012-10-08 ENCOUNTER — Ambulatory Visit (INDEPENDENT_AMBULATORY_CARE_PROVIDER_SITE_OTHER): Payer: BC Managed Care – PPO | Admitting: Family Medicine

## 2012-10-08 VITALS — BP 110/80 | Temp 98.2°F | Wt 108.0 lb

## 2012-10-08 DIAGNOSIS — F17200 Nicotine dependence, unspecified, uncomplicated: Secondary | ICD-10-CM

## 2012-10-08 DIAGNOSIS — K219 Gastro-esophageal reflux disease without esophagitis: Secondary | ICD-10-CM

## 2012-10-08 MED ORDER — VARENICLINE TARTRATE 1 MG PO TABS: ORAL_TABLET | ORAL | Status: DC

## 2012-10-08 MED ORDER — OMEPRAZOLE 40 MG PO CPDR: DELAYED_RELEASE_CAPSULE | ORAL | Status: DC

## 2012-10-08 NOTE — Patient Instructions (Signed)
Restart the Chantix 1 mg           One half tab every morning  Taper by 2 per week  Return in 3 months for followup

## 2012-10-08 NOTE — Progress Notes (Signed)
  Subjective:    Patient ID: RISHIKA MCCOLLOM, female    DOB: Apr 18, 1946, 67 y.o.   MRN: [id number]  HPI Quinisha is a 67 year old female who comes in today for reevaluation of swimmer's ear  We gave her some ear drops use them and she like her ears rechecked  We also started on the Chantix program however her insurance ran out and she states she cannot afford the medication  She also has a lesion on the right calf she would like checked   Review of Systems Review of systems otherwise negative    Objective:   Physical Exam Well-developed well-nourished thin female no acute distress lesion on the right calf is a seborrheic keratosis       Assessment & Plan:  Swimmer's ear resolved  Tobacco abuse refill Chantix  Seborrheic keratosis lower extremity return when necessary

## 2012-11-24 ENCOUNTER — Other Ambulatory Visit: Payer: Self-pay | Admitting: Family Medicine

## 2013-01-08 ENCOUNTER — Ambulatory Visit: Payer: BC Managed Care – PPO | Admitting: Family Medicine

## 2013-01-10 ENCOUNTER — Ambulatory Visit: Payer: BC Managed Care – PPO | Admitting: Family Medicine

## 2013-08-01 ENCOUNTER — Telehealth: Payer: Self-pay | Admitting: Family Medicine

## 2013-08-01 NOTE — Telephone Encounter (Signed)
Pt had a flu shot at work

## 2013-08-02 NOTE — Telephone Encounter (Signed)
Noted  

## 2014-01-29 ENCOUNTER — Other Ambulatory Visit: Payer: Self-pay | Admitting: Family Medicine

## 2014-04-30 ENCOUNTER — Other Ambulatory Visit: Payer: Self-pay | Admitting: Family Medicine

## 2014-05-01 ENCOUNTER — Other Ambulatory Visit: Payer: Self-pay | Admitting: Family Medicine

## 2014-05-05 ENCOUNTER — Other Ambulatory Visit: Payer: Self-pay | Admitting: Family Medicine

## 2014-07-30 ENCOUNTER — Other Ambulatory Visit: Payer: Self-pay | Admitting: Family Medicine

## 2014-08-04 ENCOUNTER — Telehealth: Payer: Self-pay | Admitting: Family Medicine

## 2014-08-04 MED ORDER — OMEPRAZOLE 40 MG PO CPDR: 40.0000 mg | DELAYED_RELEASE_CAPSULE | Freq: Every morning | ORAL | Status: DC

## 2014-08-04 NOTE — Telephone Encounter (Signed)
Pt request refill omeprazole (PRILOSEC) 40 MG capsule 1/day Pt made appt for 08/28/14. Can you refill until then? [facility] [address]

## 2014-08-22 LAB — HM MAMMOGRAPHY: HM Mammogram: NEGATIVE

## 2014-08-28 ENCOUNTER — Ambulatory Visit: Payer: BC Managed Care – PPO | Admitting: Family Medicine

## 2014-09-18 ENCOUNTER — Ambulatory Visit: Payer: Self-pay | Admitting: Family Medicine

## 2014-10-09 ENCOUNTER — Ambulatory Visit: Payer: Self-pay | Admitting: Family Medicine

## 2014-10-23 ENCOUNTER — Encounter: Payer: Self-pay | Admitting: Family Medicine

## 2014-10-23 ENCOUNTER — Ambulatory Visit (INDEPENDENT_AMBULATORY_CARE_PROVIDER_SITE_OTHER): Payer: BLUE CROSS/BLUE SHIELD | Admitting: Family Medicine

## 2014-10-23 VITALS — BP 130/80 | Temp 98.1°F | Wt 110.0 lb

## 2014-10-23 DIAGNOSIS — F17200 Nicotine dependence, unspecified, uncomplicated: Secondary | ICD-10-CM

## 2014-10-23 DIAGNOSIS — Z720 Tobacco use: Secondary | ICD-10-CM

## 2014-10-23 NOTE — Patient Instructions (Signed)
Chantix 1 mg.......... one half tab daily in the morning or if you have side effects take a half a tablet Monday Wednesday Friday  Nicotine gum.......... substitute the gum for cigarette  Return when necessary  I know you can do it!!!!!!!!!!!!!

## 2014-10-23 NOTE — Progress Notes (Signed)
   Subjective:    Patient ID: Erika Tucker, female    DOB: 04/28/1946, 69 y.o.   MRN: [id number]  HPI 1 need a is a 69 year old female,,,,,,, still works 40 hours a week [employer],,, who comes in today for follow-up of tobacco abuse  She's decreased her cigarette consumption from 20 a day down to 7 a day. She seems to be stuck on that number. She got the Chantix filled but never took it because of the potential side effects.   Review of Systems    review of systems negative Objective:   Physical Exam  Well-developed well-nourished female no acute distress vital signs stable she's afebrile      Assessment & Plan:  Tobacco abuse.............. continue tapering...Marland Kitchen.Marland Kitchen.Marland Kitchen. trial of nicotine gum and the Chantix 1 mg....... one half tab daily in the morning.

## 2014-10-23 NOTE — Progress Notes (Signed)
Pre visit review using our clinic review tool, if applicable. No additional management support is needed unless otherwise documented below in the visit note. 

## 2014-11-04 ENCOUNTER — Other Ambulatory Visit: Payer: Self-pay | Admitting: Family Medicine

## 2014-11-04 DIAGNOSIS — K219 Gastro-esophageal reflux disease without esophagitis: Secondary | ICD-10-CM

## 2014-11-04 MED ORDER — OMEPRAZOLE 40 MG PO CPDR
DELAYED_RELEASE_CAPSULE | ORAL | Status: DC
Start: 2014-11-04 — End: 2015-11-07

## 2014-11-04 NOTE — Telephone Encounter (Signed)
rx sent in electronically 

## 2014-11-04 NOTE — Telephone Encounter (Signed)
Pt saw dr Tawanna Coolertodd 3/10 and was told he would send omeprazole (PRILOSEC) 40 MG capsule to pharm right then. But was not done.  Will you look at this ? Thanks.  [facility] [address]  Pt states even 30 days would be ok.

## 2015-01-26 ENCOUNTER — Telehealth: Payer: Self-pay

## 2015-01-26 NOTE — Telephone Encounter (Signed)
Pt states had mammorgram done at [facility] office Feb. 2016, will have them fax over results.

## 2015-02-04 ENCOUNTER — Encounter: Payer: Self-pay | Admitting: Gastroenterology

## 2015-02-05 ENCOUNTER — Encounter: Payer: Self-pay | Admitting: Family Medicine

## 2015-10-21 ENCOUNTER — Telehealth: Payer: Self-pay | Admitting: Family Medicine

## 2015-10-21 ENCOUNTER — Ambulatory Visit (INDEPENDENT_AMBULATORY_CARE_PROVIDER_SITE_OTHER): Payer: BLUE CROSS/BLUE SHIELD | Admitting: Internal Medicine

## 2015-10-21 ENCOUNTER — Encounter: Payer: Self-pay | Admitting: Internal Medicine

## 2015-10-21 VITALS — BP 172/80 | Temp 98.2°F | Wt 103.0 lb

## 2015-10-21 DIAGNOSIS — Z87442 Personal history of urinary calculi: Secondary | ICD-10-CM

## 2015-10-21 DIAGNOSIS — R1031 Right lower quadrant pain: Secondary | ICD-10-CM

## 2015-10-21 DIAGNOSIS — K143 Hypertrophy of tongue papillae: Secondary | ICD-10-CM | POA: Diagnosis not present

## 2015-10-21 DIAGNOSIS — F17200 Nicotine dependence, unspecified, uncomplicated: Secondary | ICD-10-CM

## 2015-10-21 DIAGNOSIS — IMO0001 Reserved for inherently not codable concepts without codable children: Secondary | ICD-10-CM

## 2015-10-21 DIAGNOSIS — R030 Elevated blood-pressure reading, without diagnosis of hypertension: Secondary | ICD-10-CM

## 2015-10-21 DIAGNOSIS — R300 Dysuria: Secondary | ICD-10-CM

## 2015-10-21 LAB — POC URINALSYSI DIPSTICK (AUTOMATED)
GLUCOSE UA: NEGATIVE
Leukocytes, UA: NEGATIVE
Nitrite, UA: NEGATIVE
RBC UA: NEGATIVE
Urobilinogen, UA: 2
pH, UA: 6

## 2015-10-21 LAB — CBC WITH DIFFERENTIAL/PLATELET
Basophils Absolute: 0 10*3/uL (ref 0.0–0.1)
Basophils Relative: 0.4 % (ref 0.0–3.0)
EOS ABS: 0 10*3/uL (ref 0.0–0.7)
EOS PCT: 0.2 % (ref 0.0–5.0)
HCT: 36.6 % (ref 36.0–46.0)
Hemoglobin: 12.3 g/dL (ref 12.0–15.0)
LYMPHS ABS: 1.8 10*3/uL (ref 0.7–4.0)
Lymphocytes Relative: 33.9 % (ref 12.0–46.0)
MCHC: 33.6 g/dL (ref 30.0–36.0)
MCV: 97.8 fl (ref 78.0–100.0)
MONO ABS: 0.3 10*3/uL (ref 0.1–1.0)
Monocytes Relative: 5.3 % (ref 3.0–12.0)
NEUTROS PCT: 60.2 % (ref 43.0–77.0)
Neutro Abs: 3.2 10*3/uL (ref 1.4–7.7)
Platelets: 239 10*3/uL (ref 150.0–400.0)
RBC: 3.75 Mil/uL — AB (ref 3.87–5.11)
RDW: 14.3 % (ref 11.5–15.5)
WBC: 5.3 10*3/uL (ref 4.0–10.5)

## 2015-10-21 LAB — BASIC METABOLIC PANEL
BUN: 12 mg/dL (ref 6–23)
CALCIUM: 9.6 mg/dL (ref 8.4–10.5)
CO2: 24 mEq/L (ref 19–32)
CREATININE: 1.03 mg/dL (ref 0.40–1.20)
Chloride: 105 mEq/L (ref 96–112)
GFR: 68.55 mL/min (ref >60.00)
GLUCOSE: 93 mg/dL (ref 70–99)
POTASSIUM: 3.8 meq/L (ref 3.5–5.1)
Sodium: 141 mEq/L (ref 135–145)

## 2015-10-21 LAB — HEPATIC FUNCTION PANEL
ALT: 9 U/L (ref 0–35)
AST: 16 U/L (ref 0–37)
Albumin: 4.7 g/dL (ref 3.5–5.2)
Alkaline Phosphatase: 90 U/L (ref 39–117)
BILIRUBIN DIRECT: 0.1 mg/dL (ref 0.0–0.3)
BILIRUBIN TOTAL: 0.5 mg/dL (ref 0.2–1.2)
Total Protein: 7 g/dL (ref 6.0–8.3)

## 2015-10-21 MED ORDER — NYSTATIN 100000 UNIT/ML MT SUSP: 500000.0000 [IU] | Freq: Four times a day (QID) | OROMUCOSAL | Status: DC

## 2015-10-21 MED ORDER — TAMSULOSIN HCL 0.4 MG PO CAPS: 0.4000 mg | ORAL_CAPSULE | Freq: Every day | ORAL | Status: DC

## 2015-10-21 NOTE — Progress Notes (Signed)
Pre visit review using our clinic review tool, if applicable. No additional management support is needed unless otherwise documented below in the visit note.  Chief Complaint  Patient presents with  . Urinary Frequency    right abd pain dark urine    HPI: [name] 70 y.o.  Patient Erika Tucker  comes in today for SDA for  new problem evaluation. PCP NA Hx of renal stone.  Began last week  With urinary frequency urgency  And then RLQ pain area  Like when had  Renal stone   And  Getting worse   All day     No fever.  No vomiting  Some lower abd pain . Dec appetite   bp usually   Ok   Had gyne check dr Cousines recently and bp ok   .   No fever.    No real pain upper abd pain or pain with urination  Tylenol  No advil aeve  Trying to stop tobacco   Tongue is white and comes and goes no pain or st or fever ROS: See pertinent positives and negatives per HPI.  Past Medical History  Diagnosis Date  . GERD (gastroesophageal reflux disease)   . Nephrolithiasis   . Wrist tendonitis     No family history on file.  Social History   Social History  . Marital Status: Divorced    Spouse Name: N/A  . Number of Children: N/A  . Years of Education: N/A   Social History Main Topics  . Smoking status: Current Every Day Smoker -- 0.25 packs/day  . Smokeless tobacco: Never Used  . Alcohol Use: Yes  . Drug Use: No  . Sexual Activity: Not Asked   Other Topics Concern  . None   Social History Narrative    Outpatient Prescriptions Prior to Visit  Medication Sig Dispense Refill  . omeprazole (PRILOSEC) 40 MG capsule TAKE 1 TABLET BY MOUTH EVERY MORNING 100 capsule 3  . Vitamin D, Ergocalciferol, (DRISDOL) 50000 UNITS CAPS capsule Reported on 10/21/2015  0   No facility-administered medications prior to visit.     EXAM:  BP 172/80 mmHg  Temp(Src) 98.2 F (36.8 C) (Oral)  Wt 103 lb (46.72 kg)  Body mass index is 16.63 kg/(m^2).  GENERAL: vitals reviewed and  listed above, alert, oriented, appears well hydrated and in no acute distress HEENT: atraumatic, conjunctiva  clear, no obvious abnormalities on inspection of external nose and ears OP : no lesion edema or exudate   Tongue coated whiet no ulcers or buccal lesions NECK: no obvious masses on inspection palpation  LUNGS: clear to auscultation bilaterally, no wheezes, rales or rhonchi, good air movement CV: HRRR, no clubbing cyanosis or  peripheral edema nl cap refill  Abdomen:  Sof,t normal bowel sounds without hepatosplenomegaly, no guarding rebound or masses no CVA tenderness local tenderness low right groin area  No mass g or r   MS: moves all extremities without noticeable focal  abnormality PSYCH: pleasant and cooperative, no obvious depression or anxiety mildy anxious Skin: normal capillary refill ,turgor , color: No acute rashes ,petechiae or bruising   ASSESSMENT AND PLAN:  Discussed the following assessment and plan:  Dysuria - Plan: POCT Urinalysis Dipstick (Automated), Basic metabolic panel, CBC with Differential/Platelet, Hepatic function panel  Right lower quadrant abdominal pain - Plan: Basic metabolic panel, CBC with Differential/Platelet, Hepatic function panel, Urine culture  NEPHROLITHIASIS, HX OF - Plan: Basic metabolic panel, CBC with Differential/Platelet, Hepatic function panel,  Urine culture  Coated tongue  TOBACCO USE - trying to quit   encouragement  Elevated BP - resports ok  at the gyne/ Record review hx of stones in past   ? If proteinuira   cannot explaindark color of urine x 2+ bili  But sg is 10 30  Check   Inc fluids check for infection  And fu dr Tawanna Cooler consider getting imaging depending on how doing .   tria flomax empirically    Pt prefers not to do scan now and i agree ok to delay  -Patient advised to return or notify health care team  if symptoms worsen ,persist or new concerns arise.  Patient Instructions  Will plan culture test but no obvious   Infection. Labs today to check kidneys   increase fluids and if not better may need scan  To check for stones etc . Medication  Sent in that  Helps   kdiney stones  Go through if they are present.  Make sure  BP is ok when well .  Agree with keep trying    Stop tobacco     Neta Mends. Panosh M.D.

## 2015-10-21 NOTE — Patient Instructions (Addendum)
Will plan culture test but no obvious  Infection. Labs today to check kidneys   increase fluids and if not better may need scan  To check for stones etc . Medication  Sent in that  Helps   kdiney stones  Go through if they are present.  Make sure  BP is ok when well .  Agree with keep trying    Stop tobacco

## 2015-10-21 NOTE — Telephone Encounter (Signed)
ALSO HAD ABDOMINAL PAIN  Please give her a note  As she requested   Return on Friday

## 2015-10-21 NOTE — Telephone Encounter (Signed)
Pt was seen today with dr Fabian Sharppanosh and needs a work note from 3-8  And return to work on Friday 10-23-15. Pt was dx with dysuria

## 2015-10-22 ENCOUNTER — Encounter: Payer: Self-pay | Admitting: Family Medicine

## 2015-10-22 NOTE — Telephone Encounter (Signed)
Pt notified to pick up at the front desk. 

## 2015-10-23 ENCOUNTER — Telehealth: Payer: Self-pay | Admitting: Family Medicine

## 2015-10-23 LAB — URINE CULTURE
COLONY COUNT: NO GROWTH
ORGANISM ID, BACTERIA: NO GROWTH

## 2015-10-23 NOTE — Telephone Encounter (Signed)
Dr Fabian Sharppanosh wrote pt out of work and she needs note faxed to her employer.  Fax:  [phone]  Pt states just put her name on it.

## 2015-10-23 NOTE — Telephone Encounter (Signed)
Sent by fax.  Confirmation received that fax was successful.

## 2015-11-02 ENCOUNTER — Telehealth: Payer: Self-pay | Admitting: Family Medicine

## 2015-11-02 NOTE — Telephone Encounter (Signed)
Pt saw dr Fabian Sharppanosh last week and advised to follow up with Dr Tawanna Coolerodd.  Pt would like to see him as soon as possible. Ok to work in next week?

## 2015-11-02 NOTE — Telephone Encounter (Signed)
Okay to work in

## 2015-11-02 NOTE — Telephone Encounter (Signed)
Pt saw Dr Fabian Sharppanosh last week.  Pt states she needs rx magic mouthwash for the thrush in her mouth. The med Dr Fabian Sharppanosh prescribed is not working. \ [facility]/ [address]

## 2015-11-02 NOTE — Telephone Encounter (Signed)
Left message for pt to call back  °

## 2015-11-02 NOTE — Telephone Encounter (Signed)
Ok to rx magic mouthwash  5 cc swish  upt to qid   Disp 8 oz  Fu with dr Tawanna Coolerodd.  For further  Evaluation or advice

## 2015-11-02 NOTE — Telephone Encounter (Signed)
Pt aware of appointment 

## 2015-11-03 MED ORDER — MAGIC MOUTHWASH: 5.0000 mL | Freq: Four times a day (QID) | ORAL | Status: DC | PRN

## 2015-11-03 NOTE — Addendum Note (Signed)
Addended by: Raj JanusADKINS, Dev Dhondt T on: 11/03/2015 08:54 AM   Modules accepted: Orders

## 2015-11-03 NOTE — Telephone Encounter (Signed)
Left a message on home and cell informing the pt that a prescription has been sent to the pharmacy for her.

## 2015-11-07 ENCOUNTER — Other Ambulatory Visit: Payer: Self-pay | Admitting: Family Medicine

## 2015-11-09 ENCOUNTER — Ambulatory Visit (INDEPENDENT_AMBULATORY_CARE_PROVIDER_SITE_OTHER): Payer: BLUE CROSS/BLUE SHIELD | Admitting: Family Medicine

## 2015-11-09 ENCOUNTER — Encounter: Payer: Self-pay | Admitting: Family Medicine

## 2015-11-09 VITALS — BP 140/80 | Temp 97.7°F | Wt 103.0 lb

## 2015-11-09 DIAGNOSIS — Z87442 Personal history of urinary calculi: Secondary | ICD-10-CM | POA: Diagnosis not present

## 2015-11-09 DIAGNOSIS — Z87440 Personal history of urinary (tract) infections: Secondary | ICD-10-CM | POA: Diagnosis not present

## 2015-11-09 LAB — POCT URINALYSIS DIPSTICK
Bilirubin, UA: NEGATIVE
Glucose, UA: NEGATIVE
Ketones, UA: NEGATIVE
LEUKOCYTES UA: NEGATIVE
NITRITE UA: NEGATIVE
PROTEIN UA: NEGATIVE
Spec Grav, UA: 1.01
UROBILINOGEN UA: 0.2
pH, UA: 7

## 2015-11-09 NOTE — Patient Instructions (Signed)
Drink 3-4 glasses of water daily  Return when necessary  Kandee Keenory or Raynelle FanningJulie are 2 new adult nurse practitioners or Dr. [place]

## 2015-11-09 NOTE — Progress Notes (Signed)
Pre visit review using our clinic review tool, if applicable. No additional management support is needed unless otherwise documented below in the visit note. 

## 2015-11-09 NOTE — Progress Notes (Signed)
   Subjective:    Patient ID: Erika HowJuanita C Tucker, female    DOB: 10/23/1945, 70 y.o.   MRN: [id number]  HPI 1718 is a 70 year old female smoker who comes in today for follow-up of a kidney stone  She was seen here March 8 by Dr. Ann MakiParrish at that time she had abdominal pain a urine which showed 2+ bilirubin 1+ ketones. Gravity 1032+ protein. She's had kidney stones in the past and each at one surgical procedure to remove the stone wouldn't pass. She says when she has a stone these of the conus symptoms she has. They gave her Flomax but showing took 2 doses and lost the prescription. She's now asymptomatic. She did strain her urine and noticed some gravelly type substance in her urine which she thinks she passed the stones   Review of Systems Review of systems otherwise negative except she had a physical examination January 2017 by her gynecologist and everything was normal.    Objective:   Physical Exam Well-developed well-nourished female no acute distress vital signs stable she's afebrile urinalysis was normal study gravity 1010 with trace blood.       Assessment & Plan:  Kidney stone passed spontaneously..........Marland Kitchen. return when necessary

## 2016-04-26 DIAGNOSIS — Z23 Encounter for immunization: Secondary | ICD-10-CM | POA: Diagnosis not present

## 2016-07-31 ENCOUNTER — Other Ambulatory Visit: Payer: Self-pay | Admitting: Family Medicine

## 2016-11-22 DIAGNOSIS — H25813 Combined forms of age-related cataract, bilateral: Secondary | ICD-10-CM | POA: Diagnosis not present

## 2016-11-22 DIAGNOSIS — H43811 Vitreous degeneration, right eye: Secondary | ICD-10-CM | POA: Diagnosis not present

## 2016-11-22 DIAGNOSIS — H40053 Ocular hypertension, bilateral: Secondary | ICD-10-CM | POA: Diagnosis not present

## 2016-11-22 DIAGNOSIS — H04123 Dry eye syndrome of bilateral lacrimal glands: Secondary | ICD-10-CM | POA: Diagnosis not present

## 2016-11-22 DIAGNOSIS — H40023 Open angle with borderline findings, high risk, bilateral: Secondary | ICD-10-CM | POA: Diagnosis not present

## 2016-11-23 DIAGNOSIS — Z1231 Encounter for screening mammogram for malignant neoplasm of breast: Secondary | ICD-10-CM | POA: Diagnosis not present

## 2016-11-23 DIAGNOSIS — Z681 Body mass index (BMI) 19 or less, adult: Secondary | ICD-10-CM | POA: Diagnosis not present

## 2016-11-23 DIAGNOSIS — Z01419 Encounter for gynecological examination (general) (routine) without abnormal findings: Secondary | ICD-10-CM | POA: Diagnosis not present

## 2016-11-23 DIAGNOSIS — Z1151 Encounter for screening for human papillomavirus (HPV): Secondary | ICD-10-CM | POA: Diagnosis not present

## 2016-12-09 DIAGNOSIS — H40023 Open angle with borderline findings, high risk, bilateral: Secondary | ICD-10-CM | POA: Diagnosis not present

## 2016-12-09 DIAGNOSIS — H04123 Dry eye syndrome of bilateral lacrimal glands: Secondary | ICD-10-CM | POA: Diagnosis not present

## 2016-12-09 DIAGNOSIS — H40053 Ocular hypertension, bilateral: Secondary | ICD-10-CM | POA: Diagnosis not present

## 2016-12-09 DIAGNOSIS — H25813 Combined forms of age-related cataract, bilateral: Secondary | ICD-10-CM | POA: Diagnosis not present

## 2017-01-25 ENCOUNTER — Other Ambulatory Visit: Payer: Self-pay | Admitting: Family Medicine

## 2017-02-02 ENCOUNTER — Ambulatory Visit (INDEPENDENT_AMBULATORY_CARE_PROVIDER_SITE_OTHER): Payer: BLUE CROSS/BLUE SHIELD | Admitting: Family Medicine

## 2017-02-02 VITALS — BP 132/78 | HR 101 | Temp 98.3°F | Ht 66.0 in | Wt 111.8 lb

## 2017-02-02 DIAGNOSIS — K219 Gastro-esophageal reflux disease without esophagitis: Secondary | ICD-10-CM

## 2017-02-02 MED ORDER — OMEPRAZOLE 40 MG PO CPDR: DELAYED_RELEASE_CAPSULE | ORAL | 0 refills | Status: DC

## 2017-02-02 NOTE — Patient Instructions (Signed)
Ensure follow up with your provider in September - please discuss trying a lower dose of the acid reflux medication.  Vit D3 1000 IU daily - [facility] or [facility] brand are good options

## 2017-02-02 NOTE — Progress Notes (Signed)
  HPI:   Meds check: -reports told needed appt for refill of her prilosec -says has CPE with PCP in September -on high dose PPI for many years per her report -she reports symptoms well controlled as long as she take this -denies: dysphagia, vomiting, wt loss  Wants to know about OTC options for vit d.  ROS: See pertinent positives and negatives per HPI.  Past Medical History:  Diagnosis Date  . GERD (gastroesophageal reflux disease)   . Nephrolithiasis   . Wrist tendonitis     No past surgical history on file.  No family history on file.  Social History   Social History  . Marital status: Divorced    Spouse name: N/A  . Number of children: N/A  . Years of education: N/A   Social History Main Topics  . Smoking status: Current Every Day Smoker    Packs/day: 0.25  . Smokeless tobacco: Never Used  . Alcohol use Yes  . Drug use: No  . Sexual activity: Not on file   Other Topics Concern  . Not on file   Social History Narrative  . No narrative on file     Current Outpatient Prescriptions:  .  omeprazole (PRILOSEC) 40 MG capsule, TAKE 1 TABLET BY MOUTH EVERY MORNING, Disp: 100 capsule, Rfl: 0  EXAM:  Vitals:   02/02/17 1629  BP: 132/78  Pulse: (!) 101  Temp: 98.3 F (36.8 C)    Body mass index is 18.04 kg/m.  GENERAL: vitals reviewed and listed above, alert, oriented, appears well hydrated and in no acute distress  HEENT: atraumatic, conjunttiva clear, no obvious abnormalities on inspection of external nose and ears  NECK: no obvious masses on inspection  LUNGS: clear to auscultation bilaterally, no wheezes, rales or rhonchi, good air movement  CV: HRRR, no peripheral edema  MS: moves all extremities without noticeable abnormality  PSYCH: pleasant and cooperative, no obvious depression or anxiety  ASSESSMENT AND PLAN:  Discussed the following assessment and plan:  Gastroesophageal reflux disease, esophagitis presence not  specified  -refilled PPI - discussed risks, advised trial lower dose, lifestyle changes - she prefers to discuss with PCP -discussed OTC options for Vit D  Patient Instructions  Ensure follow up with your provider in September - please discuss trying a lower dose of the acid reflux medication.  Vit D3 1000 IU daily - [facility] or [facility] brand are good options   Kriste BasqueKIM, Aritzel Krusemark R., DO

## 2017-03-10 DIAGNOSIS — H40023 Open angle with borderline findings, high risk, bilateral: Secondary | ICD-10-CM | POA: Diagnosis not present

## 2017-03-10 DIAGNOSIS — H25813 Combined forms of age-related cataract, bilateral: Secondary | ICD-10-CM | POA: Diagnosis not present

## 2017-03-10 DIAGNOSIS — H40053 Ocular hypertension, bilateral: Secondary | ICD-10-CM | POA: Diagnosis not present

## 2017-03-14 DIAGNOSIS — H02052 Trichiasis without entropian right lower eyelid: Secondary | ICD-10-CM | POA: Diagnosis not present

## 2017-04-24 ENCOUNTER — Encounter: Payer: BLUE CROSS/BLUE SHIELD | Admitting: Family Medicine

## 2017-04-26 DIAGNOSIS — H04123 Dry eye syndrome of bilateral lacrimal glands: Secondary | ICD-10-CM | POA: Diagnosis not present

## 2017-04-26 DIAGNOSIS — H401131 Primary open-angle glaucoma, bilateral, mild stage: Secondary | ICD-10-CM | POA: Diagnosis not present

## 2017-04-26 DIAGNOSIS — H43811 Vitreous degeneration, right eye: Secondary | ICD-10-CM | POA: Diagnosis not present

## 2017-04-26 DIAGNOSIS — H25813 Combined forms of age-related cataract, bilateral: Secondary | ICD-10-CM | POA: Diagnosis not present

## 2017-04-26 DIAGNOSIS — H524 Presbyopia: Secondary | ICD-10-CM | POA: Diagnosis not present

## 2017-04-30 ENCOUNTER — Other Ambulatory Visit: Payer: Self-pay | Admitting: Family Medicine

## 2017-05-02 DIAGNOSIS — Z23 Encounter for immunization: Secondary | ICD-10-CM | POA: Diagnosis not present

## 2017-05-24 ENCOUNTER — Encounter: Payer: BLUE CROSS/BLUE SHIELD | Admitting: Family Medicine

## 2017-06-21 ENCOUNTER — Ambulatory Visit: Payer: BLUE CROSS/BLUE SHIELD | Admitting: Family Medicine

## 2017-07-26 ENCOUNTER — Other Ambulatory Visit: Payer: Self-pay | Admitting: Family Medicine

## 2017-08-29 DIAGNOSIS — H401131 Primary open-angle glaucoma, bilateral, mild stage: Secondary | ICD-10-CM | POA: Diagnosis not present

## 2017-08-29 DIAGNOSIS — H43811 Vitreous degeneration, right eye: Secondary | ICD-10-CM | POA: Diagnosis not present

## 2017-08-29 DIAGNOSIS — H25813 Combined forms of age-related cataract, bilateral: Secondary | ICD-10-CM | POA: Diagnosis not present

## 2017-08-29 DIAGNOSIS — H04123 Dry eye syndrome of bilateral lacrimal glands: Secondary | ICD-10-CM | POA: Diagnosis not present

## 2017-12-11 ENCOUNTER — Encounter: Payer: Self-pay | Admitting: Family Medicine

## 2017-12-11 ENCOUNTER — Ambulatory Visit (INDEPENDENT_AMBULATORY_CARE_PROVIDER_SITE_OTHER): Payer: BLUE CROSS/BLUE SHIELD | Admitting: Family Medicine

## 2017-12-11 VITALS — BP 150/82 | HR 98 | Temp 98.2°F | Ht 66.0 in | Wt 107.0 lb

## 2017-12-11 DIAGNOSIS — K219 Gastro-esophageal reflux disease without esophagitis: Secondary | ICD-10-CM | POA: Diagnosis not present

## 2017-12-11 DIAGNOSIS — Z23 Encounter for immunization: Secondary | ICD-10-CM

## 2017-12-11 DIAGNOSIS — Z0000 Encounter for general adult medical examination without abnormal findings: Secondary | ICD-10-CM

## 2017-12-11 DIAGNOSIS — F17200 Nicotine dependence, unspecified, uncomplicated: Secondary | ICD-10-CM | POA: Diagnosis not present

## 2017-12-11 LAB — POCT URINALYSIS DIPSTICK
Bilirubin, UA: NEGATIVE
Blood, UA: NEGATIVE
Glucose, UA: NEGATIVE
KETONES UA: NEGATIVE
Leukocytes, UA: NEGATIVE
NITRITE UA: NEGATIVE
PH UA: 6.5 (ref 5.0–8.0)
Spec Grav, UA: 1.025 (ref 1.010–1.025)
UROBILINOGEN UA: 1 U/dL

## 2017-12-11 LAB — BASIC METABOLIC PANEL
BUN: 14 mg/dL (ref 6–23)
CO2: 27 mEq/L (ref 19–32)
CREATININE: 1.02 mg/dL (ref 0.40–1.20)
Calcium: 9.4 mg/dL (ref 8.4–10.5)
Chloride: 108 mEq/L (ref 96–112)
GFR: 68.89 mL/min (ref >60.00)
Glucose, Bld: 93 mg/dL (ref 70–99)
POTASSIUM: 4 meq/L (ref 3.5–5.1)
Sodium: 143 mEq/L (ref 135–145)

## 2017-12-11 LAB — CBC WITH DIFFERENTIAL/PLATELET
BASOS ABS: 0 10*3/uL (ref 0.0–0.1)
Basophils Relative: 0.5 % (ref 0.0–3.0)
EOS ABS: 0.1 10*3/uL (ref 0.0–0.7)
Eosinophils Relative: 0.9 % (ref 0.0–5.0)
HEMATOCRIT: 37.8 % (ref 36.0–46.0)
Hemoglobin: 12.6 g/dL (ref 12.0–15.0)
LYMPHS PCT: 39.2 % (ref 12.0–46.0)
Lymphs Abs: 2.4 10*3/uL (ref 0.7–4.0)
MCHC: 33.4 g/dL (ref 30.0–36.0)
MCV: 100.5 fl — ABNORMAL HIGH (ref 78.0–100.0)
Monocytes Absolute: 0.4 10*3/uL (ref 0.1–1.0)
Monocytes Relative: 6.4 % (ref 3.0–12.0)
Neutro Abs: 3.3 10*3/uL (ref 1.4–7.7)
Neutrophils Relative %: 53 % (ref 43.0–77.0)
PLATELETS: 258 10*3/uL (ref 150.0–400.0)
RBC: 3.76 Mil/uL — AB (ref 3.87–5.11)
RDW: 14.1 % (ref 11.5–15.5)
WBC: 6.1 10*3/uL (ref 4.0–10.5)

## 2017-12-11 LAB — TSH: TSH: 0.85 u[IU]/mL (ref 0.35–4.50)

## 2017-12-11 MED ORDER — VARENICLINE TARTRATE 0.5 MG PO TABS: ORAL_TABLET | ORAL | 11 refills | Status: DC

## 2017-12-11 MED ORDER — OMEPRAZOLE 40 MG PO CPDR: DELAYED_RELEASE_CAPSULE | ORAL | 4 refills | Status: DC

## 2017-12-11 NOTE — Patient Instructions (Signed)
Labs today....... I will call you if there is anything abnormal  Start the Chantix 0.5 mg........... one tablet daily in the morning  Return next Monday for follow-up  Call your insurance company and find out where you get the new shingles vaccine  We gave you pneumonia vaccine and a tetanus booster today

## 2017-12-11 NOTE — Progress Notes (Signed)
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 She gets her mammograms yearly at the GYN office. Last mammogram year ago was normal. She also had a pelvic and Pap smear by her GYN. However she's never had issues with her Pap smears.  She's due DTaP and a pneumonia 13 and a shingles vaccine. Information given. We will give her the DTaP and Pneumovax here today information given to call insurance company for the shingles vaccine  Her blood pressures 150/82 right arm sitting position. She's never had trouble with her blood pressure in the past. We'll ask her to get a monitor and monitor blood pressure at home.  Social history she still works at [employer] at [employer].  14 point review of systems reviewed and otherwise negative except for above  BP (!) 150/82 (BP Location: Left Arm, Patient Position: Sitting, Cuff Size: Normal)   Pulse 98   Temp 98.2 F (36.8 C) (Oral)   Ht  (1.676 m)   Wt 107 lb (48.5 kg)   SpO2 97%   BMI 17.27 kg/m  Well-developed well-nourished female no acute distress vital signs stable she's afebrile except for blood pressures noted above. Examination HEENT was pertinent shows early bilateral cataracts. Neck was supple thyroid is not enlarged no carotid bruits. Cardiopulmonary exam normal except for decreased breath sounds consistent with chronic COPD. Breast exam was normal. Abdominal exam was normal. Pelvic and rectal not indicated. Extremities normal skin normal peripheral pulses actually are normal.  #1 tobacco abuse......... 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 screening chest x-ray.

## 2017-12-18 ENCOUNTER — Encounter: Payer: Self-pay | Admitting: Family Medicine

## 2017-12-18 ENCOUNTER — Ambulatory Visit: Payer: BLUE CROSS/BLUE SHIELD | Admitting: Family Medicine

## 2017-12-18 VITALS — BP 158/86 | HR 97 | Temp 98.1°F | Wt 108.0 lb

## 2017-12-18 DIAGNOSIS — I10 Essential (primary) hypertension: Secondary | ICD-10-CM | POA: Diagnosis not present

## 2017-12-18 DIAGNOSIS — F17200 Nicotine dependence, unspecified, uncomplicated: Secondary | ICD-10-CM | POA: Diagnosis not present

## 2017-12-18 MED ORDER — LISINOPRIL 10 MG PO TABS: 10.0000 mg | ORAL_TABLET | Freq: Every day | ORAL | 3 refills | Status: DC

## 2017-12-18 NOTE — Patient Instructions (Signed)
Start the Chantix in the morning  Taper off the cigarettes as outlined  Lisinopril 10 mg........Marland Kitchen 1 daily in the morning for high blood pressure. Side effects include hives and/or cough. If you develop hives stop the medication immediately. If you develop a cough and it's not bad then continue the medication for the cough is bad then let us know we will change her medication  Follow-up the third week in September

## 2017-12-18 NOTE — Progress Notes (Signed)
76 is a 72 year old female who comes in today for follow-up COPD tobacco abuse and hypertension Set  She has history of chronic tobacco abuse. We gave her the Chantix week ago. She hadn't started it yet. Advised her start the Chantix tomorrow morning  She been monitoring of blood pressure at home. 20 160/90. Her mother had hypertension. Because of her history of tobacco abuse and her genetics I don't think it's unusual that she now has hypertension.  BP (!) 158/86 (BP Location: Left Arm, Patient Position: Sitting, Cuff Size: Normal)   Pulse 97   Temp 98.1 F (36.7 C) (Oral)   Wt 108 lb (49 kg)   BMI 17.43 kg/m  Well-developed well-nourished female no acute distress vital signs stable she's afebrile BP 160/90 right arm sitting position  #1 COPD,,,,,, tobacco abuse,,,,,,,,,,, start the Chantix program now  #2 hypertension,,,,,,,, begin lisinopril 10 mg daily follow-up as outlined,

## 2018-03-07 DIAGNOSIS — H04123 Dry eye syndrome of bilateral lacrimal glands: Secondary | ICD-10-CM | POA: Diagnosis not present

## 2018-03-07 DIAGNOSIS — H401131 Primary open-angle glaucoma, bilateral, mild stage: Secondary | ICD-10-CM | POA: Diagnosis not present

## 2018-03-07 DIAGNOSIS — H01004 Unspecified blepharitis left upper eyelid: Secondary | ICD-10-CM | POA: Diagnosis not present

## 2018-03-07 DIAGNOSIS — H01002 Unspecified blepharitis right lower eyelid: Secondary | ICD-10-CM | POA: Diagnosis not present

## 2018-03-07 DIAGNOSIS — H01001 Unspecified blepharitis right upper eyelid: Secondary | ICD-10-CM | POA: Diagnosis not present

## 2018-04-30 ENCOUNTER — Ambulatory Visit: Payer: BLUE CROSS/BLUE SHIELD | Admitting: Family Medicine

## 2018-05-29 DIAGNOSIS — Z23 Encounter for immunization: Secondary | ICD-10-CM | POA: Diagnosis not present

## 2018-06-20 DIAGNOSIS — H1131 Conjunctival hemorrhage, right eye: Secondary | ICD-10-CM | POA: Diagnosis not present

## 2018-06-25 ENCOUNTER — Ambulatory Visit: Payer: BLUE CROSS/BLUE SHIELD | Admitting: Family Medicine

## 2018-06-25 ENCOUNTER — Encounter: Payer: Self-pay | Admitting: Family Medicine

## 2018-06-25 DIAGNOSIS — I10 Essential (primary) hypertension: Secondary | ICD-10-CM | POA: Diagnosis not present

## 2018-06-25 DIAGNOSIS — F17200 Nicotine dependence, unspecified, uncomplicated: Secondary | ICD-10-CM

## 2018-06-25 MED ORDER — LISINOPRIL 20 MG PO TABS: 20.0000 mg | ORAL_TABLET | Freq: Every day | ORAL | 3 refills | Status: DC

## 2018-06-25 NOTE — Progress Notes (Signed)
Erika Tucker is a 72 year old female smoker........ 7 cigarettes a day...Marland KitchenMarland KitchenMarland Kitchen cut down to 3 a day which Chantix..... Who comes in today for follow-up of hypertension  She is been on lisinopril 10 mg daily.  She has not been monitoring her blood pressure at home  We saw her last spring for physical examination started on the Chantix.  She took it her cigarette consumption dropped from 7 today to 3 she stopped the Chantix and now she is back to 7 a day.  She works full-time at [employer]  BP right arm sitting position by me 160/100  1.  Hypertension not at goal........... Stressed stop smoking.... Increase lisinopril to 20 mg daily..... Follow-up with Dr. Salomon Fick in 4 to 6 weeks  2.  Tobacco abuse......Marland Kitchen encouraged to restart the Chantix program

## 2018-06-25 NOTE — Patient Instructions (Signed)
Increase the lisinopril to 20 mg daily  Check your blood pressure at home every morning  We return in 4 to 6 weeks to see Dr. Salomon Fick bring a record of all your blood pressure readings in the machine that you using at home.  Restart the Chantix program and taper off the nicotine as we discussed  Thank you for coming to see me all these years.  Good luck to you in the future

## 2018-07-17 DIAGNOSIS — Z01419 Encounter for gynecological examination (general) (routine) without abnormal findings: Secondary | ICD-10-CM | POA: Diagnosis not present

## 2018-07-17 DIAGNOSIS — Z681 Body mass index (BMI) 19 or less, adult: Secondary | ICD-10-CM | POA: Diagnosis not present

## 2018-07-17 DIAGNOSIS — Z1151 Encounter for screening for human papillomavirus (HPV): Secondary | ICD-10-CM | POA: Diagnosis not present

## 2018-07-17 DIAGNOSIS — Z1231 Encounter for screening mammogram for malignant neoplasm of breast: Secondary | ICD-10-CM | POA: Diagnosis not present

## 2018-07-20 ENCOUNTER — Ambulatory Visit: Payer: BLUE CROSS/BLUE SHIELD | Admitting: Family Medicine

## 2018-07-20 ENCOUNTER — Encounter: Payer: Self-pay | Admitting: Family Medicine

## 2018-07-20 VITALS — BP 130/80 | HR 96 | Temp 97.9°F | Wt 107.0 lb

## 2018-07-20 DIAGNOSIS — K219 Gastro-esophageal reflux disease without esophagitis: Secondary | ICD-10-CM | POA: Diagnosis not present

## 2018-07-20 DIAGNOSIS — J302 Other seasonal allergic rhinitis: Secondary | ICD-10-CM

## 2018-07-20 DIAGNOSIS — F17210 Nicotine dependence, cigarettes, uncomplicated: Secondary | ICD-10-CM

## 2018-07-20 DIAGNOSIS — I10 Essential (primary) hypertension: Secondary | ICD-10-CM | POA: Diagnosis not present

## 2018-07-20 MED ORDER — OMEPRAZOLE 40 MG PO CPDR: DELAYED_RELEASE_CAPSULE | ORAL | 3 refills | Status: DC

## 2018-07-20 MED ORDER — FLUTICASONE PROPIONATE 50 MCG/ACT NA SUSP: 1.0000 | Freq: Every day | NASAL | 2 refills | Status: DC

## 2018-07-20 NOTE — Patient Instructions (Addendum)
Allergic Rhinitis, Adult Allergic rhinitis is an allergic reaction that affects the mucous membrane inside the nose. It causes sneezing, a runny or stuffy nose, and the feeling of mucus going down the back of the throat (postnasal drip). Allergic rhinitis can be mild to severe. There are two types of allergic rhinitis:  Seasonal. This type is also called hay fever. It happens only during certain seasons.  Perennial. This type can happen at any time of the year.  What are the causes? This condition happens when the body's defense system (immune system) responds to certain harmless substances called allergens as though they were germs.  Seasonal allergic rhinitis is triggered by pollen, which can come from grasses, trees, and weeds. Perennial allergic rhinitis may be caused by:  House dust mites.  Pet dander.  Mold spores.  What are the signs or symptoms? Symptoms of this condition include:  Sneezing.  Runny or stuffy nose (nasal congestion).  Postnasal drip.  Itchy nose.  Tearing of the eyes.  Trouble sleeping.  Daytime sleepiness.  How is this diagnosed? This condition may be diagnosed based on:  Your medical history.  A physical exam.  Tests to check for related conditions, such as: ? Asthma. ? Pink eye. ? Ear infection. ? Upper respiratory infection.  Tests to find out which allergens trigger your symptoms. These may include skin or blood tests.  How is this treated? There is no cure for this condition, but treatment can help control symptoms. Treatment may include:  Taking medicines that block allergy symptoms, such as antihistamines. Medicine may be given as a shot, nasal spray, or pill.  Avoiding the allergen.  Desensitization. This treatment involves getting ongoing shots until your body becomes less sensitive to the allergen. This treatment may be done if other treatments do not help.  If taking medicine and avoiding the allergen does not work, new,  stronger medicines may be prescribed.  Follow these instructions at home:  Find out what you are allergic to. Common allergens include smoke, dust, and pollen.  Avoid the things you are allergic to. These are some things you can do to help avoid allergens: ? Replace carpet with wood, tile, or vinyl flooring. Carpet can trap dander and dust. ? Do not smoke. Do not allow smoking in your home. ? Change your heating and air conditioning filter at least once a month. ? During allergy season:  Keep windows closed as much as possible.  Plan outdoor activities when pollen counts are lowest. This is usually during the evening hours.  When coming indoors, change clothing and shower before sitting on furniture or bedding.  Take over-the-counter and prescription medicines only as told by your health care provider.  Keep all follow-up visits as told by your health care provider. This is important. Contact a health care provider if:  You have a fever.  You develop a persistent cough.  You make whistling sounds when you breathe (you wheeze).  Your symptoms interfere with your normal daily activities. Get help right away if:  You have shortness of breath. Summary  This condition can be managed by taking medicines as directed and avoiding allergens.  Contact your health care provider if you develop a persistent cough or fever.  During allergy season, keep windows closed as much as possible. This information is not intended to replace advice given to you by your health care provider. Make sure you discuss any questions you have with your health care provider. Document Released: 04/26/2001 Document Revised: 09/08/2016  Document Reviewed: 09/08/2016 Elsevier Interactive Patient Education  2018 [employer].  Steps to Quit Smoking Smoking tobacco can be bad for your health. It can also affect almost every organ in your body. Smoking puts you and people around you at risk for many serious  long-lasting (chronic) diseases. Quitting smoking is hard, but it is one of the best things that you can do for your health. It is never too late to quit. What are the benefits of quitting smoking? When you quit smoking, you lower your risk for getting serious diseases and conditions. They can include:  Lung cancer or lung disease.  Heart disease.  Stroke.  Heart attack.  Not being able to have children (infertility).  Weak bones (osteoporosis) and broken bones (fractures).  If you have coughing, wheezing, and shortness of breath, those symptoms may get better when you quit. You may also get sick less often. If you are pregnant, quitting smoking can help to lower your chances of having a baby of low birth weight. What can I do to help me quit smoking? Talk with your doctor about what can help you quit smoking. Some things you can do (strategies) include:  Quitting smoking totally, instead of slowly cutting back how much you smoke over a period of time.  Going to in-person counseling. You are more likely to quit if you go to many counseling sessions.  Using resources and support systems, such as: ? Agricultural engineer with a Veterinary surgeon. ? Phone quitlines. ? Automotive engineer. ? Support groups or group counseling. ? Text messaging programs. ? Mobile phone apps or applications.  Taking medicines. Some of these medicines may have nicotine in them. If you are pregnant or breastfeeding, do not take any medicines to quit smoking unless your doctor says it is okay. Talk with your doctor about counseling or other things that can help you.  Talk with your doctor about using more than one strategy at the same time, such as taking medicines while you are also going to in-person counseling. This can help make quitting easier. What things can I do to make it easier to quit? Quitting smoking might feel very hard at first, but there is a lot that you can do to make it easier. Take these  steps:  Talk to your family and friends. Ask them to support and encourage you.  Call phone quitlines, reach out to support groups, or work with a Veterinary surgeon.  Ask people who smoke to not smoke around you.  Avoid places that make you want (trigger) to smoke, such as: ? Bars. ? Parties. ? Smoke-break areas at work.  Spend time with people who do not smoke.  Lower the stress in your life. Stress can make you want to smoke. Try these things to help your stress: ? Getting regular exercise. ? Deep-breathing exercises. ? Yoga. ? Meditating. ? Doing a body scan. To do this, close your eyes, focus on one area of your body at a time from head to toe, and notice which parts of your body are tense. Try to relax the muscles in those areas.  Download or buy apps on your mobile phone or tablet that can help you stick to your quit plan. There are many free apps, such as QuitGuide from the [employer] developer for Disease Control and Prevention). You can find more support from smokefree.gov and other websites.  This information is not intended to replace advice given to you by your health care provider. Make sure you discuss  any questions you have with your health care provider. Document Released: 05/28/2009 Document Revised: 03/29/2016 Document Reviewed: 12/16/2014 Elsevier Interactive Patient Education  2018 [employer]  Managing Your Hypertension Hypertension is commonly called high blood pressure. This is when the force of your blood pressing against the walls of your arteries is too strong. Arteries are blood vessels that carry blood from your heart throughout your body. Hypertension forces the heart to work harder to pump blood, and may cause the arteries to become narrow or stiff. Having untreated or uncontrolled hypertension can cause heart attack, stroke, kidney disease, and other problems. What are blood pressure readings? A blood pressure reading consists of a higher number over a lower number.  Ideally, your blood pressure should be below 120/80. The first ("top") number is called the systolic pressure. It is a measure of the pressure in your arteries as your heart beats. The second ("bottom") number is called the diastolic pressure. It is a measure of the pressure in your arteries as the heart relaxes. What does my blood pressure reading mean? Blood pressure is classified into four stages. Based on your blood pressure reading, your health care provider may use the following stages to determine what type of treatment you need, if any. Systolic pressure and diastolic pressure are measured in a unit called mm Hg. Normal  Systolic pressure: below 120.  Diastolic pressure: below 80. Elevated  Systolic pressure: 120-129.  Diastolic pressure: below 80. Hypertension stage 1  Systolic pressure: 130-139.  Diastolic pressure: 80-89. Hypertension stage 2  Systolic pressure: 140 or above.  Diastolic pressure: 90 or above. What health risks are associated with hypertension? Managing your hypertension is an important responsibility. Uncontrolled hypertension can lead to:  A heart attack.  A stroke.  A weakened blood vessel (aneurysm).  Heart failure.  Kidney damage.  Eye damage.  Metabolic syndrome.  Memory and concentration problems.  What changes can I make to manage my hypertension? Hypertension can be managed by making lifestyle changes and possibly by taking medicines. Your health care provider will help you make a plan to bring your blood pressure within a normal range. Eating and drinking  Eat a diet that is high in fiber and potassium, and low in salt (sodium), added sugar, and fat. An example eating plan is called the DASH (Dietary Approaches to Stop Hypertension) diet. To eat this way: ? Eat plenty of fresh fruits and vegetables. Try to fill half of your plate at each meal with fruits and vegetables. ? Eat whole grains, such as whole wheat pasta, brown rice, or  whole grain bread. Fill about one quarter of your plate with whole grains. ? Eat low-fat diary products. ? Avoid fatty cuts of meat, processed or cured meats, and poultry with skin. Fill about one quarter of your plate with lean proteins such as fish, chicken without skin, beans, eggs, and tofu. ? Avoid premade and processed foods. These tend to be higher in sodium, added sugar, and fat.  Reduce your daily sodium intake. Most people with hypertension should eat less than 1,500 mg of sodium a day.  Limit alcohol intake to no more than 1 drink a day for nonpregnant women and 2 drinks a day for men. One drink equals 12 oz of beer, 5 oz of wine, or 1 oz of hard liquor. Lifestyle  Work with your health care provider to maintain a healthy body weight, or to lose weight. Ask what an ideal weight is for you.  Get at least  30 minutes of exercise that causes your heart to beat faster (aerobic exercise) most days of the week. Activities may include walking, swimming, or biking.  Include exercise to strengthen your muscles (resistance exercise), such as weight lifting, as part of your weekly exercise routine. Try to do these types of exercises for 30 minutes at least 3 days a week.  Do not use any products that contain nicotine or tobacco, such as cigarettes and e-cigarettes. If you need help quitting, ask your health care provider.  Control any long-term (chronic) conditions you have, such as high cholesterol or diabetes. Monitoring  Monitor your blood pressure at home as told by your health care provider. Your personal target blood pressure may vary depending on your medical conditions, your age, and other factors.  Have your blood pressure checked regularly, as often as told by your health care provider. Working with your health care provider  Review all the medicines you take with your health care provider because there may be side effects or interactions.  Talk with your health care provider  about your diet, exercise habits, and other lifestyle factors that may be contributing to hypertension.  Visit your health care provider regularly. Your health care provider can help you create and adjust your plan for managing hypertension. Will I need medicine to control my blood pressure? Your health care provider may prescribe medicine if lifestyle changes are not enough to get your blood pressure under control, and if:  Your systolic blood pressure is 130 or higher.  Your diastolic blood pressure is 80 or higher.  Take medicines only as told by your health care provider. Follow the directions carefully. Blood pressure medicines must be taken as prescribed. The medicine does not work as well when you skip doses. Skipping doses also puts you at risk for problems. Contact a health care provider if:  You think you are having a reaction to medicines you have taken.  You have repeated (recurrent) headaches.  You feel dizzy.  You have swelling in your ankles.  You have trouble with your vision. Get help right away if:  You develop a severe headache or confusion.  You have unusual weakness or numbness, or you feel faint.  You have severe pain in your chest or abdomen.  You vomit repeatedly.  You have trouble breathing. Summary  Hypertension is when the force of blood pumping through your arteries is too strong. If this condition is not controlled, it may put you at risk for serious complications.  Your personal target blood pressure may vary depending on your medical conditions, your age, and other factors. For most people, a normal blood pressure is less than 120/80.  Hypertension is managed by lifestyle changes, medicines, or both. Lifestyle changes include weight loss, eating a healthy, low-sodium diet, exercising more, and limiting alcohol. This information is not intended to replace advice given to you by your health care provider. Make sure you discuss any questions you  have with your health care provider. Document Released: 04/25/2012 Document Revised: 06/29/2016 Document Reviewed: 06/29/2016 Elsevier Interactive Patient Education  [employer].

## 2018-07-20 NOTE — Progress Notes (Signed)
Subjective:    Patient ID: Erika Tucker, female    DOB: 11-21-1945, 72 y.o.   MRN: [id number]  No chief complaint on file.   HPI Patient was seen today for follow-up on chronic conditions and TOC, previously seen by Dr. Tawanna Cooler.  HTN: -Taking lisinopril 20 mg  -checking bp at home 134/80 -not drinking any water.  GERD: -Taking Prilosec daily -needs refill -tomatoes and chocolate give pt symptoms -notes improvement sine taking prilosec  Nicotine dependence -was taking Chantix but stopped -smoking 10 cigs/day.  Was smoking 7 cigs at last [facility]. -states will quit when cigarettes go up to $7 per pack.  (right now they are $6)  Pt request "something for sinuses".  Endorses rhinorrhea and headaches.  Also notes really no drainage in the back of her throat.  Past Medical History:  Diagnosis Date  . GERD (gastroesophageal reflux disease)   . Nephrolithiasis   . Wrist tendonitis     Allergies  Allergen Reactions  . Aspirin     REACTION: GI Intolerance    ROS General: Denies fever, chills, night sweats, changes in weight, changes in appetite HEENT: Denies ear pain, changes in vision, rhinorrhea, sore throat  + rhinorrhea, headache, post nasal drainage CV: Denies CP, palpitations, SOB, orthopnea Pulm: Denies SOB, cough, wheezing GI: Denies abdominal pain, nausea, vomiting, diarrhea, constipation  +reflux GU: Denies dysuria, hematuria, frequency, vaginal discharge Msk: Denies muscle cramps, joint pains Neuro: Denies weakness, numbness, tingling Skin: Denies rashes, bruising Psych: Denies depression, anxiety, hallucinations     Objective:    Blood pressure 130/80, pulse 96, temperature 97.9 F (36.6 C), temperature source Oral, weight 107 lb (48.5 kg), SpO2 98 %.  Gen. Pleasant, well-nourished, in no distress, normal affect  HEENT: [place]/AT, face symmetric, wearing glasses, conjunctiva clear, no scleral icterus, PERRLA, nares patent without drainage, pharynx with postnasal  drainage, no erythema or exudate. TMs full b/l Lungs: no accessory muscle use, CTAB, no wheezes or rales Cardiovascular: RRR, no m/r/g, no peripheral edema Abdomen: BS present, soft, NT/ND Neuro:  A&Ox3, CN II-XII intact, normal gait Skin:  Warm, no lesions/ rash.  Small pustules on face near right eye  Wt Readings from Last 3 Encounters:  07/20/18 107 lb (48.5 kg)  12/18/17 108 lb (49 kg)  12/11/17 107 lb (48.5 kg)    Lab Results  Component Value Date   WBC 6.1 12/11/2017   HGB 12.6 12/11/2017   HCT 37.8 12/11/2017   PLT 258.0 12/11/2017   GLUCOSE 93 12/11/2017   CHOL 150 10/23/2009   TRIG 110.0 10/23/2009   HDL 51.00 10/23/2009   LDLCALC 77 10/23/2009   ALT 9 10/21/2015   AST 16 10/21/2015   NA 143 12/11/2017   K 4.0 12/11/2017   CL 108 12/11/2017   CREATININE 1.02 12/11/2017   BUN 14 12/11/2017   CO2 27 12/11/2017   TSH 0.85 12/11/2017    Assessment/Plan:  Essential hypertension -Slightly elevated -Continue lisinopril 20 mg daily -Lifestyle modifications including increasing p.o. intake of water. -Continue checking BP at home -Given handout  Gastroesophageal reflux disease, esophagitis presence not specified  -Avoid foods known to cause symptoms - Plan: omeprazole (PRILOSEC) 40 MG capsule  Cigarette nicotine dependence without complication -Smoking cessation counseling greater than 3 minutes, less than 10 minutes -Currently smoking 1 pack every 2 days -Thinking about quitting.  Encouraged to cut down -Consider restarting Chantix -We will readdress at each visit  Seasonal allergic rhinitis, unspecified trigger -Given handout - Plan: fluticasone (FLONASE) 50  MCG/ACT nasal spray  Follow-up in 1 to 3 months  Abbe AmsterdamShannon Avyay Coger, MD

## 2018-09-14 LAB — HM MAMMOGRAPHY

## 2018-09-18 DIAGNOSIS — H43811 Vitreous degeneration, right eye: Secondary | ICD-10-CM | POA: Diagnosis not present

## 2018-09-18 DIAGNOSIS — H401131 Primary open-angle glaucoma, bilateral, mild stage: Secondary | ICD-10-CM | POA: Diagnosis not present

## 2018-09-18 DIAGNOSIS — H04123 Dry eye syndrome of bilateral lacrimal glands: Secondary | ICD-10-CM | POA: Diagnosis not present

## 2018-09-18 DIAGNOSIS — H25813 Combined forms of age-related cataract, bilateral: Secondary | ICD-10-CM | POA: Diagnosis not present

## 2018-10-19 ENCOUNTER — Ambulatory Visit: Payer: BLUE CROSS/BLUE SHIELD | Admitting: Family Medicine

## 2018-10-26 ENCOUNTER — Other Ambulatory Visit: Payer: Self-pay

## 2018-10-26 ENCOUNTER — Encounter: Payer: Self-pay | Admitting: Family Medicine

## 2018-10-26 ENCOUNTER — Ambulatory Visit: Payer: BLUE CROSS/BLUE SHIELD | Admitting: Family Medicine

## 2018-10-26 VITALS — BP 140/72 | HR 84 | Temp 98.1°F | Wt 108.0 lb

## 2018-10-26 DIAGNOSIS — I10 Essential (primary) hypertension: Secondary | ICD-10-CM | POA: Diagnosis not present

## 2018-10-26 MED ORDER — LISINOPRIL 30 MG PO TABS: 30.0000 mg | ORAL_TABLET | Freq: Every day | ORAL | 3 refills | Status: DC

## 2018-10-26 NOTE — Progress Notes (Signed)
Subjective:    Patient ID: Erika Tucker, female    DOB: 01-Nov-1945, 73 y.o.   MRN: [id number]  No chief complaint on file.   HPI Patient was seen today for follow-up.  Pt taking lisinopril 20 mg daily for blood pressure.  Pt has not obtained a BP cuff to monitor her blood pressure at home.  Denies headache, changes in vision, chest pain.  Pt states she feels good overall.  Pt does not eat much salt.  Pt working on increasing p.o. intake of water.  Smoking cigs 1 pk q 2 days, states waste more than she smokes.  Past Medical History:  Diagnosis Date  . GERD (gastroesophageal reflux disease)   . Nephrolithiasis   . Wrist tendonitis     Allergies  Allergen Reactions  . Aspirin     REACTION: GI Intolerance    ROS General: Denies fever, chills, night sweats, changes in weight, changes in appetite HEENT: Denies headaches, ear pain, changes in vision, rhinorrhea, sore throat CV: Denies CP, palpitations, SOB, orthopnea Pulm: Denies SOB, cough, wheezing GI: Denies abdominal pain, nausea, vomiting, diarrhea, constipation GU: Denies dysuria, hematuria, frequency, vaginal discharge Msk: Denies muscle cramps, joint pains Neuro: Denies weakness, numbness, tingling Skin: Denies rashes, bruising Psych: Denies depression, anxiety, hallucinations      Objective:    Blood pressure 140/72, pulse 84, temperature 98.1 F (36.7 C), temperature source Oral, weight 108 lb (49 kg), SpO2 98 %.  Gen. Pleasant, well-nourished, in no distress, normal affect   HEENT: [place]/AT, face symmetric, no scleral icterus, PERRLA,  nares patent without drainage, pharynx without erythema or exudate. Lungs: no accessory muscle use, CTAB, no wheezes or rales Cardiovascular: RRR, no m/r/g, no peripheral edema Neuro:  A&Ox3, CN II-XII intact, normal gait Skin:  Warm, no lesions/ rash  Wt Readings from Last 3 Encounters:  10/26/18 108 lb (49 kg)  07/20/18 107 lb (48.5 kg)  12/18/17 108 lb (49 kg)    Lab Results   Component Value Date   WBC 6.1 12/11/2017   HGB 12.6 12/11/2017   HCT 37.8 12/11/2017   PLT 258.0 12/11/2017   GLUCOSE 93 12/11/2017   CHOL 150 10/23/2009   TRIG 110.0 10/23/2009   HDL 51.00 10/23/2009   LDLCALC 77 10/23/2009   ALT 9 10/21/2015   AST 16 10/21/2015   NA 143 12/11/2017   K 4.0 12/11/2017   CL 108 12/11/2017   CREATININE 1.02 12/11/2017   BUN 14 12/11/2017   CO2 27 12/11/2017   TSH 0.85 12/11/2017    Assessment/Plan:  Essential hypertension  -elevated -will increase lisinopril from 20 mg to 30 mg daily -Discussed lifestyle modifications -Patient encouraged to check BP at home and keep a log to bring with her clinic -Advised to be aware of possibility of dry cough - Plan: lisinopril (PRINIVIL,ZESTRIL) 30 MG tablet  F/u 1 month, sooner if needed.  Abbe Amsterdam, MD

## 2018-10-26 NOTE — Patient Instructions (Addendum)
Please obtain a blood pressure cuff so that you can monitor your blood pressure at home.  [employer] makes a reliable blood pressure cuff.  They can be found at your local drugstore, [employer], [employer], online such as [employer]. Managing Your Hypertension Hypertension is commonly called high blood pressure. This is when the force of your blood pressing against the walls of your arteries is too strong. Arteries are blood vessels that carry blood from your heart throughout your body. Hypertension forces the heart to work harder to pump blood, and may cause the arteries to become narrow or stiff. Having untreated or uncontrolled hypertension can cause heart attack, stroke, kidney disease, and other problems. What are blood pressure readings? A blood pressure reading consists of a higher number over a lower number. Ideally, your blood pressure should be below 120/80. The first ("top") number is called the systolic pressure. It is a measure of the pressure in your arteries as your heart beats. The second ("bottom") number is called the diastolic pressure. It is a measure of the pressure in your arteries as the heart relaxes. What does my blood pressure reading mean? Blood pressure is classified into four stages. Based on your blood pressure reading, your health care provider may use the following stages to determine what type of treatment you need, if any. Systolic pressure and diastolic pressure are measured in a unit called mm Hg. Normal  Systolic pressure: below 120.  Diastolic pressure: below 80. Elevated  Systolic pressure: 120-129.  Diastolic pressure: below 80. Hypertension stage 1  Systolic pressure: 130-139.  Diastolic pressure: 80-89. Hypertension stage 2  Systolic pressure: 140 or above.  Diastolic pressure: 90 or above. What health risks are associated with hypertension? Managing your hypertension is an important responsibility. Uncontrolled hypertension can lead to:  A heart attack.  A  stroke.  A weakened blood vessel (aneurysm).  Heart failure.  Kidney damage.  Eye damage.  Metabolic syndrome.  Memory and concentration problems. What changes can I make to manage my hypertension? Hypertension can be managed by making lifestyle changes and possibly by taking medicines. Your health care provider will help you make a plan to bring your blood pressure within a normal range. Eating and drinking   Eat a diet that is high in fiber and potassium, and low in salt (sodium), added sugar, and fat. An example eating plan is called the DASH (Dietary Approaches to Stop Hypertension) diet. To eat this way: ? Eat plenty of fresh fruits and vegetables. Try to fill half of your plate at each meal with fruits and vegetables. ? Eat whole grains, such as whole wheat pasta, brown rice, or whole grain bread. Fill about one quarter of your plate with whole grains. ? Eat low-fat diary products. ? Avoid fatty cuts of meat, processed or cured meats, and poultry with skin. Fill about one quarter of your plate with lean proteins such as fish, chicken without skin, beans, eggs, and tofu. ? Avoid premade and processed foods. These tend to be higher in sodium, added sugar, and fat.  Reduce your daily sodium intake. Most people with hypertension should eat less than 1,500 mg of sodium a day.  Limit alcohol intake to no more than 1 drink a day for nonpregnant women and 2 drinks a day for men. One drink equals 12 oz of beer, 5 oz of wine, or 1 oz of hard liquor. Lifestyle  Work with your health care provider to maintain a healthy body weight, or to lose weight. Ask  what an ideal weight is for you.  Get at least 30 minutes of exercise that causes your heart to beat faster (aerobic exercise) most days of the week. Activities may include walking, swimming, or biking.  Include exercise to strengthen your muscles (resistance exercise), such as weight lifting, as part of your weekly exercise routine. Try  to do these types of exercises for 30 minutes at least 3 days a week.  Do not use any products that contain nicotine or tobacco, such as cigarettes and e-cigarettes. If you need help quitting, ask your health care provider.  Control any long-term (chronic) conditions you have, such as high cholesterol or diabetes. Monitoring  Monitor your blood pressure at home as told by your health care provider. Your personal target blood pressure may vary depending on your medical conditions, your age, and other factors.  Have your blood pressure checked regularly, as often as told by your health care provider. Working with your health care provider  Review all the medicines you take with your health care provider because there may be side effects or interactions.  Talk with your health care provider about your diet, exercise habits, and other lifestyle factors that may be contributing to hypertension.  Visit your health care provider regularly. Your health care provider can help you create and adjust your plan for managing hypertension. Will I need medicine to control my blood pressure? Your health care provider may prescribe medicine if lifestyle changes are not enough to get your blood pressure under control, and if:  Your systolic blood pressure is 130 or higher.  Your diastolic blood pressure is 80 or higher. Take medicines only as told by your health care provider. Follow the directions carefully. Blood pressure medicines must be taken as prescribed. The medicine does not work as well when you skip doses. Skipping doses also puts you at risk for problems. Contact a health care provider if:  You think you are having a reaction to medicines you have taken.  You have repeated (recurrent) headaches.  You feel dizzy.  You have swelling in your ankles.  You have trouble with your vision. Get help right away if:  You develop a severe headache or confusion.  You have unusual weakness or  numbness, or you feel faint.  You have severe pain in your chest or abdomen.  You vomit repeatedly.  You have trouble breathing. Summary  Hypertension is when the force of blood pumping through your arteries is too strong. If this condition is not controlled, it may put you at risk for serious complications.  Your personal target blood pressure may vary depending on your medical conditions, your age, and other factors. For most people, a normal blood pressure is less than 120/80.  Hypertension is managed by lifestyle changes, medicines, or both. Lifestyle changes include weight loss, eating a healthy, low-sodium diet, exercising more, and limiting alcohol. This information is not intended to replace advice given to you by your health care provider. Make sure you discuss any questions you have with your health care provider. Document Released: 04/25/2012 Document Revised: 06/29/2016 Document Reviewed: 06/29/2016 Elsevier Interactive Patient Education  2019 [employer].  How to Take Your Blood Pressure You can take your blood pressure at home with a machine. You may need to check your blood pressure at home:  To check if you have high blood pressure (hypertension).  To check your blood pressure over time.  To make sure your blood pressure medicine is working. Supplies needed: You will  need a blood pressure machine, or monitor. You can buy one at a drugstore or online. When choosing one:  Choose one with an arm cuff.  Choose one that wraps around your upper arm. Only one finger should fit between your arm and the cuff.  Do not choose one that measures your blood pressure from your wrist or finger. Your doctor can suggest a monitor. How to prepare Avoid these things for 30 minutes before checking your blood pressure:  Drinking caffeine.  Drinking alcohol.  Eating.  Smoking.  Exercising. Five minutes before checking your blood pressure:  Pee.  Sit in a dining chair.  Avoid sitting in a soft couch or armchair.  Be quiet. Do not talk. How to take your blood pressure Follow the instructions that came with your machine. If you have a digital blood pressure monitor, these may be the instructions: 1. Sit up straight. 2. Place your feet on the floor. Do not cross your ankles or legs. 3. Rest your left arm at the level of your heart. You may rest it on a table, desk, or chair. 4. Pull up your shirt sleeve. 5. Wrap the blood pressure cuff around the upper part of your left arm. The cuff should be 1 inch (2.5 cm) above your elbow. It is best to wrap the cuff around bare skin. 6. Fit the cuff snugly around your arm. You should be able to place only one finger between the cuff and your arm. 7. Put the cord inside the groove of your elbow. 8. Press the power button. 9. Sit quietly while the cuff fills with air and loses air. 10. Write down the numbers on the screen. 11. Wait 2-3 minutes and then repeat steps 1-10. What do the numbers mean? Two numbers make up your blood pressure. The first number is called systolic pressure. The second is called diastolic pressure. An example of a blood pressure reading is "120 over 80" (or 120/80). If you are an adult and do not have a medical condition, use this guide to find out if your blood pressure is normal: Normal  First number: below 120.  Second number: below 80. Elevated  First number: 120-129.  Second number: below 80. Hypertension stage 1  First number: 130-139.  Second number: 80-89. Hypertension stage 2  First number: 140 or above.  Second number: 90 or above. Your blood pressure is above normal even if only the top or bottom number is above normal. Follow these instructions at home:  Check your blood pressure as often as your doctor tells you to.  Take your monitor to your next doctor's appointment. Your doctor will: ? Make sure you are using it correctly. ? Make sure it is working right.  Make  sure you understand what your blood pressure numbers should be.  Tell your doctor if your medicines are causing side effects. Contact a doctor if:  Your blood pressure keeps being high. Get help right away if:  Your first blood pressure number is higher than 180.  Your second blood pressure number is higher than 120. This information is not intended to replace advice given to you by your health care provider. Make sure you discuss any questions you have with your health care provider. Document Released: 07/14/2008 Document Revised: 06/29/2016 Document Reviewed: 01/08/2016 Elsevier Interactive Patient Education  2019 [employer]

## 2018-11-06 ENCOUNTER — Other Ambulatory Visit: Payer: Self-pay | Admitting: Family Medicine

## 2018-11-06 DIAGNOSIS — J302 Other seasonal allergic rhinitis: Secondary | ICD-10-CM

## 2018-11-26 ENCOUNTER — Ambulatory Visit (INDEPENDENT_AMBULATORY_CARE_PROVIDER_SITE_OTHER): Payer: BLUE CROSS/BLUE SHIELD | Admitting: Family Medicine

## 2018-11-26 ENCOUNTER — Other Ambulatory Visit: Payer: Self-pay

## 2018-11-26 ENCOUNTER — Encounter: Payer: Self-pay | Admitting: Family Medicine

## 2018-11-26 DIAGNOSIS — I10 Essential (primary) hypertension: Secondary | ICD-10-CM | POA: Diagnosis not present

## 2018-11-26 MED ORDER — LISINOPRIL 30 MG PO TABS: 30.0000 mg | ORAL_TABLET | Freq: Every day | ORAL | 5 refills | Status: DC

## 2018-11-26 NOTE — Progress Notes (Signed)
Virtual Visit via Telephone Note  I connected with Erika Tucker on 11/26/18 at  4:30 PM EDT by telephone and verified that I am speaking with the correct person using two identifiers.   I discussed the limitations, risks, security and privacy concerns of performing an evaluation and management service by telephone and the availability of in person appointments. I also discussed with the patient that there may be a patient responsible charge related to this service. The patient expressed understanding and agreed to proceed.  Location patient: home Location provider: work or home office Participants present for the call: patient, provider Patient did not have a visit in the prior 7 days to address this/these issue(s).   History of Present Illness: Pt following up on bp. Taking lisinopril 30 mg. States doing well with the increased dose.  Checking bp daily, may be 130/80, slightly higher prior to taking med.  Pt states she is not sure she is using her bp cuff correctly.  Not using salt to cook with, staying away from pork, eating mostly beef.  Drinking more water ~2 bottles per day.  Pt states she had a good birthday.  Pt states she usually takes a trip for her birthday, but has not been able 2/2 COVID-19.  Pt states her children catered food and surprised her.    Pt states she is still going to work at [employer].  Pt states if you are coughing or seem sick at her job they are sending ppl home.  Pt wearing a mask and gloves if she has to go out, but is staying home for the most part.   Observations/Objective: Patient sounds cheerful and well on the phone. I do not appreciate any SOB. Speech and thought processing are grossly intact. 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  Pt advised to call ahead to see if policy has changed given COVID-19. -discussed making lifestyle  changes/modifications to help improve bp. -lisinopril 30 mg refilled.  Follow Up Instructions: F/u in next few months, sooner if needed.  I did not refer this patient for an OV in the next 24 hours for this/these issue(s).  I discussed the assessment and treatment plan with the patient. The patient was provided an opportunity to ask questions and all were answered. The patient agreed with the plan and demonstrated an understanding of the instructions.   The patient was advised to call back or seek an in-person evaluation if the symptoms worsen or if the condition fails to improve as anticipated.  I provided 14 minutes of non-face-to-face time during this encounter.   Erika Saint, MD

## 2018-12-21 ENCOUNTER — Encounter: Payer: Self-pay | Admitting: Gastroenterology

## 2019-01-16 ENCOUNTER — Encounter: Payer: Self-pay | Admitting: Family Medicine

## 2019-01-16 ENCOUNTER — Other Ambulatory Visit: Payer: Self-pay

## 2019-01-16 ENCOUNTER — Ambulatory Visit (INDEPENDENT_AMBULATORY_CARE_PROVIDER_SITE_OTHER): Payer: BC Managed Care – PPO | Admitting: Family Medicine

## 2019-01-16 VITALS — BP 150/76 | HR 94 | Temp 98.2°F | Wt 106.0 lb

## 2019-01-16 DIAGNOSIS — I10 Essential (primary) hypertension: Secondary | ICD-10-CM | POA: Diagnosis not present

## 2019-01-16 DIAGNOSIS — R2232 Localized swelling, mass and lump, left upper limb: Secondary | ICD-10-CM

## 2019-01-16 MED ORDER — LISINOPRIL 40 MG PO TABS: 40.0000 mg | ORAL_TABLET | Freq: Every day | ORAL | 5 refills | Status: DC

## 2019-01-16 NOTE — Progress Notes (Addendum)
Subjective:    Patient ID: Erika Tucker, female    DOB: Jun 19, 1946, 73 y.o.   MRN: [id number]  No chief complaint on file.   HPI Patient was seen today for f/u on bp.  Pt taking lisinopril 30 mg daily.  Pt states bp at home typically 150s/70s-80s.  Pt denies HAs, changes in vision, CP, dry cough.  Pt drinking plenty of water, walking at work, and eating limited sodium.  Past Medical History:  Diagnosis Date  . GERD (gastroesophageal reflux disease)   . Nephrolithiasis   . Wrist tendonitis     Allergies  Allergen Reactions  . Aspirin     REACTION: GI Intolerance    ROS General: Denies fever, chills, night sweats, changes in weight, changes in appetite HEENT: Denies headaches, ear pain, changes in vision, rhinorrhea, sore throat CV: Denies CP, palpitations, SOB, orthopnea Pulm: Denies SOB, cough, wheezing GI: Denies abdominal pain, nausea, vomiting, diarrhea, constipation GU: Denies dysuria, hematuria, frequency, vaginal discharge Msk: Denies muscle cramps, joint pains  +lesion of finger or L hand. Neuro: Denies weakness, numbness, tingling Skin: Denies rashes, bruising Psych: Denies depression, anxiety, hallucinations     Objective:    Blood pressure (!) 150/76, pulse 94, temperature 98.2 F (36.8 C), temperature source Oral, weight 106 lb (48.1 kg), SpO2 98 %. Recheck 159/78  Gen. Pleasant, well-nourished, in no distress, normal affect  HEENT: [place]/AT, face symmetric, no scleral icterus, PERRLA, nares patent without drainage Lungs: no accessory muscle use, CTAB, no wheezes or rales Cardiovascular: RRR, no m/r/g, no peripheral edema MSK: nodule of palmar surface of L 3rd digit  Neuro:  A&Ox3, CN II-XII intact, normal gait Skin:  Warm, no lesions/ rash   Wt Readings from Last 3 Encounters:  10/26/18 108 lb (49 kg)  07/20/18 107 lb (48.5 kg)  12/18/17 108 lb (49 kg)    Lab Results  Component Value Date   WBC 6.1 12/11/2017   HGB 12.6 12/11/2017   HCT 37.8  12/11/2017   PLT 258.0 12/11/2017   GLUCOSE 93 12/11/2017   CHOL 150 10/23/2009   TRIG 110.0 10/23/2009   HDL 51.00 10/23/2009   LDLCALC 77 10/23/2009   ALT 9 10/21/2015   AST 16 10/21/2015   NA 143 12/11/2017   K 4.0 12/11/2017   CL 108 12/11/2017   CREATININE 1.02 12/11/2017   BUN 14 12/11/2017   CO2 27 12/11/2017   TSH 0.85 12/11/2017    Assessment/Plan:  Essential hypertension  -uncontrolled -bp repeat by this provider remained elevated -will increase lisinopril to 40 mg. -discussed may need additional medication -pt encouraged ton continue lifestyle modifications and checking bp regularly - Plan: lisinopril (ZESTRIL) 40 MG tablet, Basic metabolic panel  Nodule of finger, L hand -discussed possibly ganglion cyst -discussed various treatment options, offered referral to Ortho.  Pt declines at this time.  Will continue to monitor.   -given precautions  F/u in 2-4 wks  Abbe Amsterdam, MD

## 2019-02-04 ENCOUNTER — Ambulatory Visit: Payer: Self-pay

## 2019-02-04 NOTE — Telephone Encounter (Signed)
Summary: med question    Pt wants to know if she can take benadryl? It is due to her breaking out with small bumps that itch.      Returned call to patient regarding Benadryl. She stated that developed a small rash under her breast, thigh and back. She thinks it is from eating a tomato last week. Advised to try a cream to the area, such as benadryl cream or hydrocortisone cream. Advised that taking Benadryl by mouth can be sedating or make her sleepy if she is not used to taking it. If she takes Benadryl by mouth to start with half tablet and take it at bedtime. Also to try taking a cool shower or bath to  help with the itching. She voiced understanding. Will call back for any other concerns. Routing to flow at [facility] at [facility] for review.

## 2019-02-04 NOTE — Telephone Encounter (Signed)
  Reason for Disposition . Caller has medication question only, adult not sick, and triager answers question  Answer Assessment - Initial Assessment Questions 1. SYMPTOMS: "Do you have any symptoms?"     yes 2. SEVERITY: If symptoms are present, ask "Are they mild, moderate or severe?"      Mild to moderate  Protocols used: MEDICATION QUESTION CALL-A-AH

## 2019-02-06 NOTE — Telephone Encounter (Signed)
ok 

## 2019-02-06 NOTE — Telephone Encounter (Signed)
Please advise 

## 2019-02-07 NOTE — Telephone Encounter (Signed)
Spoke with pt verbalized understanding of Dr Volanda Napoleon advise, pt states that the bumps on her skin are getting better and itch less, pt was offered a virtual visit appointment stated that she will call if she needs one.Pt Advised to call if the bumps gets worse.

## 2019-03-20 ENCOUNTER — Other Ambulatory Visit: Payer: Self-pay

## 2019-03-20 ENCOUNTER — Ambulatory Visit: Payer: BC Managed Care – PPO | Admitting: Family Medicine

## 2019-03-28 ENCOUNTER — Ambulatory Visit: Payer: BC Managed Care – PPO | Admitting: Family Medicine

## 2019-03-28 ENCOUNTER — Encounter: Payer: Self-pay | Admitting: Family Medicine

## 2019-03-28 ENCOUNTER — Other Ambulatory Visit: Payer: Self-pay

## 2019-03-28 VITALS — BP 120/62 | HR 98 | Temp 98.3°F | Ht 66.0 in | Wt 103.6 lb

## 2019-03-28 DIAGNOSIS — I10 Essential (primary) hypertension: Secondary | ICD-10-CM | POA: Diagnosis not present

## 2019-03-28 DIAGNOSIS — F17210 Nicotine dependence, cigarettes, uncomplicated: Secondary | ICD-10-CM | POA: Diagnosis not present

## 2019-03-28 DIAGNOSIS — K219 Gastro-esophageal reflux disease without esophagitis: Secondary | ICD-10-CM | POA: Diagnosis not present

## 2019-03-28 DIAGNOSIS — J302 Other seasonal allergic rhinitis: Secondary | ICD-10-CM

## 2019-03-28 MED ORDER — FLUTICASONE PROPIONATE 50 MCG/ACT NA SUSP: 1.0000 | Freq: Every day | NASAL | 7 refills | Status: DC

## 2019-03-28 MED ORDER — LISINOPRIL 40 MG PO TABS: 40.0000 mg | ORAL_TABLET | Freq: Every day | ORAL | 3 refills | Status: DC

## 2019-03-28 MED ORDER — OMEPRAZOLE 40 MG PO CPDR: 40.0000 mg | DELAYED_RELEASE_CAPSULE | Freq: Every day | ORAL | 3 refills | Status: DC

## 2019-03-28 NOTE — Patient Instructions (Addendum)
Health Maintenance Due  Topic Date Due  . Hepatitis C Screening  09-16-1945  . DEXA SCAN  11/19/2012  . MAMMOGRAM  08/22/2016  . COLONOSCOPY  12/02/2018  . PNA vac Low Risk Adult (2 of 2 - PPSV23) 12/12/2018  . INFLUENZA VACCINE  03/16/2019     Food Choices for Gastroesophageal Reflux Disease, Adult When you have gastroesophageal reflux disease (GERD), the foods you eat and your eating habits are very important. Choosing the right foods can help ease your discomfort. Think about working with a nutrition specialist (dietitian) to help you make good choices. What are tips for following this plan?  Meals  Choose healthy foods that are low in fat, such as fruits, vegetables, whole grains, low-fat dairy products, and lean meat, fish, and poultry.  Eat small meals often instead of 3 large meals a day. Eat your meals slowly, and in a place where you are relaxed. Avoid bending over or lying down until 2-3 hours after eating.  Avoid eating meals 2-3 hours before bed.  Avoid drinking a lot of liquid with meals.  Cook foods using methods other than frying. Bake, grill, or broil food instead.  Avoid or limit: ? Chocolate. ? Peppermint or spearmint. ? Alcohol. ? Pepper. ? Black and decaffeinated coffee. ? Black and decaffeinated tea. ? Bubbly (carbonated) soft drinks. ? Caffeinated energy drinks and soft drinks.  Limit high-fat foods such as: ? Fatty meat or fried foods. ? Whole milk, cream, butter, or ice cream. ? Nuts and nut butters. ? Pastries, donuts, and sweets made with butter or shortening.  Avoid foods that cause symptoms. These foods may be different for everyone. Common foods that cause symptoms include: ? Tomatoes. ? Oranges, lemons, and limes. ? Peppers. ? Spicy food. ? Onions and garlic. ? Vinegar. Lifestyle  Maintain a healthy weight. Ask your doctor what weight is healthy for you. If you need to lose weight, work with your doctor to do so safely.  Exercise for  at least 30 minutes for 5 or more days each week, or as told by your doctor.  Wear loose-fitting clothes.  Do not smoke. If you need help quitting, ask your doctor.  Sleep with the head of your bed higher than your feet. Use a wedge under the mattress or blocks under the bed frame to raise the head of the bed. Summary  When you have gastroesophageal reflux disease (GERD), food and lifestyle choices are very important in easing your symptoms.  Eat small meals often instead of 3 large meals a day. Eat your meals slowly, and in a place where you are relaxed.  Limit high-fat foods such as fatty meat or fried foods.  Avoid bending over or lying down until 2-3 hours after eating.  Avoid peppermint and spearmint, caffeine, alcohol, and chocolate. This information is not intended to replace advice given to you by your health care provider. Make sure you discuss any questions you have with your health care provider. Document Released: 01/31/2012 Document Revised: 11/22/2018 Document Reviewed: 09/06/2016 Elsevier Patient Education  2020 [employer]  Managing Your Hypertension Hypertension is commonly called high blood pressure. This is when the force of your blood pressing against the walls of your arteries is too strong. Arteries are blood vessels that carry blood from your heart throughout your body. Hypertension forces the heart to work harder to pump blood, and may cause the arteries to become narrow or stiff. Having untreated or uncontrolled hypertension can cause heart attack, stroke, kidney disease,  and other problems. What are blood pressure readings? A blood pressure reading consists of a higher number over a lower number. Ideally, your blood pressure should be below 120/80. The first ("top") number is called the systolic pressure. It is a measure of the pressure in your arteries as your heart beats. The second ("bottom") number is called the diastolic pressure. It is a measure of the  pressure in your arteries as the heart relaxes. What does my blood pressure reading mean? Blood pressure is classified into four stages. Based on your blood pressure reading, your health care provider may use the following stages to determine what type of treatment you need, if any. Systolic pressure and diastolic pressure are measured in a unit called mm Hg. Normal  Systolic pressure: below 120.  Diastolic pressure: below 80. Elevated  Systolic pressure: 120-129.  Diastolic pressure: below 80. Hypertension stage 1  Systolic pressure: 130-139.  Diastolic pressure: 80-89. Hypertension stage 2  Systolic pressure: 140 or above.  Diastolic pressure: 90 or above. What health risks are associated with hypertension? Managing your hypertension is an important responsibility. Uncontrolled hypertension can lead to:  A heart attack.  A stroke.  A weakened blood vessel (aneurysm).  Heart failure.  Kidney damage.  Eye damage.  Metabolic syndrome.  Memory and concentration problems. What changes can I make to manage my hypertension? Hypertension can be managed by making lifestyle changes and possibly by taking medicines. Your health care provider will help you make a plan to bring your blood pressure within a normal range. Eating and drinking   Eat a diet that is high in fiber and potassium, and low in salt (sodium), added sugar, and fat. An example eating plan is called the DASH (Dietary Approaches to Stop Hypertension) diet. To eat this way: ? Eat plenty of fresh fruits and vegetables. Try to fill half of your plate at each meal with fruits and vegetables. ? Eat whole grains, such as whole wheat pasta, brown rice, or whole grain bread. Fill about one quarter of your plate with whole grains. ? Eat low-fat diary products. ? Avoid fatty cuts of meat, processed or cured meats, and poultry with skin. Fill about one quarter of your plate with lean proteins such as fish, chicken  without skin, beans, eggs, and tofu. ? Avoid premade and processed foods. These tend to be higher in sodium, added sugar, and fat.  Reduce your daily sodium intake. Most people with hypertension should eat less than 1,500 mg of sodium a day.  Limit alcohol intake to no more than 1 drink a day for nonpregnant women and 2 drinks a day for men. One drink equals 12 oz of beer, 5 oz of wine, or 1 oz of hard liquor. Lifestyle  Work with your health care provider to maintain a healthy body weight, or to lose weight. Ask what an ideal weight is for you.  Get at least 30 minutes of exercise that causes your heart to beat faster (aerobic exercise) most days of the week. Activities may include walking, swimming, or biking.  Include exercise to strengthen your muscles (resistance exercise), such as weight lifting, as part of your weekly exercise routine. Try to do these types of exercises for 30 minutes at least 3 days a week.  Do not use any products that contain nicotine or tobacco, such as cigarettes and e-cigarettes. If you need help quitting, ask your health care provider.  Control any long-term (chronic) conditions you have, such as high cholesterol or  diabetes. Monitoring  Monitor your blood pressure at home as told by your health care provider. Your personal target blood pressure may vary depending on your medical conditions, your age, and other factors.  Have your blood pressure checked regularly, as often as told by your health care provider. Working with your health care provider  Review all the medicines you take with your health care provider because there may be side effects or interactions.  Talk with your health care provider about your diet, exercise habits, and other lifestyle factors that may be contributing to hypertension.  Visit your health care provider regularly. Your health care provider can help you create and adjust your plan for managing hypertension. Will I need  medicine to control my blood pressure? Your health care provider may prescribe medicine if lifestyle changes are not enough to get your blood pressure under control, and if:  Your systolic blood pressure is 130 or higher.  Your diastolic blood pressure is 80 or higher. Take medicines only as told by your health care provider. Follow the directions carefully. Blood pressure medicines must be taken as prescribed. The medicine does not work as well when you skip doses. Skipping doses also puts you at risk for problems. Contact a health care provider if:  You think you are having a reaction to medicines you have taken.  You have repeated (recurrent) headaches.  You feel dizzy.  You have swelling in your ankles.  You have trouble with your vision. Get help right away if:  You develop a severe headache or confusion.  You have unusual weakness or numbness, or you feel faint.  You have severe pain in your chest or abdomen.  You vomit repeatedly.  You have trouble breathing. Summary  Hypertension is when the force of blood pumping through your arteries is too strong. If this condition is not controlled, it may put you at risk for serious complications.  Your personal target blood pressure may vary depending on your medical conditions, your age, and other factors. For most people, a normal blood pressure is less than 120/80.  Hypertension is managed by lifestyle changes, medicines, or both. Lifestyle changes include weight loss, eating a healthy, low-sodium diet, exercising more, and limiting alcohol. This information is not intended to replace advice given to you by your health care provider. Make sure you discuss any questions you have with your health care provider. Document Released: 04/25/2012 Document Revised: 11/23/2018 Document Reviewed: 06/29/2016 Elsevier Patient Education  2020 [employer].

## 2019-03-28 NOTE — Progress Notes (Signed)
Subjective:    Patient ID: Erika Tucker, female    DOB: 17-Jul-1946, 73 y.o.   MRN: [id number]  Chief Complaint  Patient presents with  . Hypertension    HPI Patient was seen today for f/u on HTN.  Pt taking lisinopril 40 mg daily.  Pt making diet changes, trying to increase water intake-drinking 6 fluids daily.  Has BP monitor at home.  States has not been checking recently as was afraid to move the readings.  Pt still smoking 1 pack of cigarettes every 2 days.  Tried Chantix in the past.  Patient inquires about medicine for acid reflux.  Notes reflux symptoms when drinking morning coffee.  Previously seen omeprazole 40 mg daily  Also requesting refill.  Past Medical History:  Diagnosis Date  . GERD (gastroesophageal reflux disease)   . Nephrolithiasis   . Wrist tendonitis     Allergies  Allergen Reactions  . Aspirin     REACTION: GI Intolerance    ROS General: Denies fever, chills, night sweats, changes in weight, changes in appetite HEENT: Denies headaches, ear pain, changes in vision, rhinorrhea, sore throat CV: Denies CP, palpitations, SOB, orthopnea Pulm: Denies SOB, cough, wheezing GI: Denies abdominal pain, nausea, vomiting, diarrhea, constipation + acid reflux GU: Denies dysuria, hematuria, frequency, vaginal discharge Msk: Denies muscle cramps, joint pains Neuro: Denies weakness, numbness, tingling Skin: Denies rashes, bruising Psych: Denies depression, anxiety, hallucinations      Objective:    Blood pressure 120/62, pulse 98, temperature 98.3 F (36.8 C), temperature source Oral, height 5\' 6"  (1.676 m), weight 103 lb 9.6 oz (47 kg), SpO2 98 %.   Gen. Pleasant, well-nourished, in no distress, normal affect  HEENT: [place]/AT, face symmetric, conjunctiva clear, no scleral icterus, PERRLA, nares patent without drainage Lungs: no accessory muscle use, CTAB, no wheezes or rales Cardiovascular: RRR, no m/r/g, no peripheral edema Abdomen: BS present, soft,  NT/ND Neuro:  A&Ox3, CN II-XII intact, normal gait Skin:  Warm, no lesions/ rash   Wt Readings from Last 3 Encounters:  03/28/19 103 lb 9.6 oz (47 kg)  01/16/19 106 lb (48.1 kg)  10/26/18 108 lb (49 kg)    Lab Results  Component Value Date   WBC 6.1 12/11/2017   HGB 12.6 12/11/2017   HCT 37.8 12/11/2017   PLT 258.0 12/11/2017   GLUCOSE 93 12/11/2017   CHOL 150 10/23/2009   TRIG 110.0 10/23/2009   HDL 51.00 10/23/2009   LDLCALC 77 10/23/2009   ALT 9 10/21/2015   AST 16 10/21/2015   NA 143 12/11/2017   K 4.0 12/11/2017   CL 108 12/11/2017   CREATININE 1.02 12/11/2017   BUN 14 12/11/2017   CO2 27 12/11/2017   TSH 0.85 12/11/2017    Assessment/Plan:  Essential hypertension  -controlled -Continue lifestyle modifications -Smoking cessation strongly encouraged - Plan: lisinopril (ZESTRIL) 40 MG tablet  Seasonal allergic rhinitis, unspecified trigger  - Plan: fluticasone (FLONASE) 50 MCG/ACT nasal spray  Gastroesophageal reflux disease, esophagitis presence not specified  - Plan: omeprazole (PRILOSEC) 40 MG capsule  Nicotine dependence without complication -Smoking cessation counseling greater than left, less than 10 minutes -Smoking 1 pack every 2 days -Discussed options to help patient quit.  Patient patient to consider -We will reevaluate at each visit  F/u 3 months, sooner if needed  Grier Mitts, MD

## 2019-05-22 DIAGNOSIS — Z23 Encounter for immunization: Secondary | ICD-10-CM | POA: Diagnosis not present

## 2019-07-15 DIAGNOSIS — Z20828 Contact with and (suspected) exposure to other viral communicable diseases: Secondary | ICD-10-CM | POA: Diagnosis not present

## 2019-07-16 DIAGNOSIS — Z20828 Contact with and (suspected) exposure to other viral communicable diseases: Secondary | ICD-10-CM | POA: Diagnosis not present

## 2019-08-15 DIAGNOSIS — Z1231 Encounter for screening mammogram for malignant neoplasm of breast: Secondary | ICD-10-CM | POA: Diagnosis not present

## 2019-08-15 DIAGNOSIS — Z681 Body mass index (BMI) 19 or less, adult: Secondary | ICD-10-CM | POA: Diagnosis not present

## 2019-08-15 DIAGNOSIS — Z01419 Encounter for gynecological examination (general) (routine) without abnormal findings: Secondary | ICD-10-CM | POA: Diagnosis not present

## 2019-08-15 DIAGNOSIS — Z1151 Encounter for screening for human papillomavirus (HPV): Secondary | ICD-10-CM | POA: Diagnosis not present

## 2019-09-09 ENCOUNTER — Encounter: Payer: Self-pay | Admitting: Family Medicine

## 2019-09-20 DIAGNOSIS — Z20828 Contact with and (suspected) exposure to other viral communicable diseases: Secondary | ICD-10-CM | POA: Diagnosis not present

## 2019-09-21 DIAGNOSIS — Z20828 Contact with and (suspected) exposure to other viral communicable diseases: Secondary | ICD-10-CM | POA: Diagnosis not present

## 2019-10-01 DIAGNOSIS — H401131 Primary open-angle glaucoma, bilateral, mild stage: Secondary | ICD-10-CM | POA: Diagnosis not present

## 2019-10-01 DIAGNOSIS — H43811 Vitreous degeneration, right eye: Secondary | ICD-10-CM | POA: Diagnosis not present

## 2019-10-01 DIAGNOSIS — H0100A Unspecified blepharitis right eye, upper and lower eyelids: Secondary | ICD-10-CM | POA: Diagnosis not present

## 2019-10-01 DIAGNOSIS — H25813 Combined forms of age-related cataract, bilateral: Secondary | ICD-10-CM | POA: Diagnosis not present

## 2019-10-02 DIAGNOSIS — Z20828 Contact with and (suspected) exposure to other viral communicable diseases: Secondary | ICD-10-CM | POA: Diagnosis not present

## 2019-10-16 ENCOUNTER — Other Ambulatory Visit: Payer: Self-pay

## 2019-10-17 ENCOUNTER — Encounter: Payer: Self-pay | Admitting: Family Medicine

## 2019-10-17 ENCOUNTER — Ambulatory Visit: Payer: BC Managed Care – PPO | Admitting: Family Medicine

## 2019-10-17 VITALS — BP 132/90 | HR 86 | Temp 97.6°F | Wt 103.0 lb

## 2019-10-17 DIAGNOSIS — I10 Essential (primary) hypertension: Secondary | ICD-10-CM | POA: Diagnosis not present

## 2019-10-17 DIAGNOSIS — J0100 Acute maxillary sinusitis, unspecified: Secondary | ICD-10-CM

## 2019-10-17 DIAGNOSIS — F17210 Nicotine dependence, cigarettes, uncomplicated: Secondary | ICD-10-CM

## 2019-10-17 DIAGNOSIS — F17200 Nicotine dependence, unspecified, uncomplicated: Secondary | ICD-10-CM

## 2019-10-17 DIAGNOSIS — Z20828 Contact with and (suspected) exposure to other viral communicable diseases: Secondary | ICD-10-CM | POA: Diagnosis not present

## 2019-10-17 MED ORDER — AMOXICILLIN 500 MG PO TABS: 500.0000 mg | ORAL_TABLET | Freq: Two times a day (BID) | ORAL | 0 refills | Status: AC

## 2019-10-17 MED ORDER — AMLODIPINE BESYLATE 2.5 MG PO TABS: 2.5000 mg | ORAL_TABLET | Freq: Every day | ORAL | 4 refills | Status: DC

## 2019-10-17 NOTE — Progress Notes (Signed)
Subjective:    Patient ID: Erika Tucker, female    DOB: [birth year], 74 y.o.   MRN: [id number]  No chief complaint on file.   HPI Patient was seen today for acute concern and f/u.  Pt endorses HAs and sinus pressure x 1 wk.  Hurts all over her head.  Tried OTC mucinex without relief.  Had a negative COVID test at work today.  Pt smoking 10 cigarettes per day.   Pt has not been checking bp.  Taking lisinopril 40 mg daily.  Past Medical History:  Diagnosis Date  . GERD (gastroesophageal reflux disease)   . Nephrolithiasis   . Wrist tendonitis     Allergies  Allergen Reactions  . Aspirin     REACTION: GI Intolerance    ROS General: Denies fever, chills, night sweats, changes in weight, changes in appetite HEENT: Denies ear pain, changes in vision, rhinorrhea, sore throat  +HAs, sinus pressure CV: Denies CP, palpitations, SOB, orthopnea Pulm: Denies SOB, cough, wheezing GI: Denies abdominal pain, nausea, vomiting, diarrhea, constipation GU: Denies dysuria, hematuria, frequency, vaginal discharge Msk: Denies muscle cramps, joint pains Neuro: Denies weakness, numbness, tingling Skin: Denies rashes, bruising Psych: Denies depression, anxiety, hallucinations    Objective:    Blood pressure 132/90, pulse 86, temperature 97.6 F (36.4 C), temperature source Temporal, weight 103 lb (46.7 kg), SpO2 98 %.  Gen. Pleasant, thin, in no distress, normal affect   HEENT: [place]/AT, face symmetric, no scleral icterus, PERRLA, EOMI, nares patent without drainage,  TTp of L maxillary sinus and ethmoid sinuses.  pharynx without erythema or exudate.  TM.s normal b/l Lungs: no accessory muscle use, CTAB, no wheezes or rales Cardiovascular: RRR, no m/r/g, no peripheral edema Neuro:  A&Ox3, CN II-XII intact, normal gait  Wt Readings from Last 3 Encounters:  03/28/19 103 lb 9.6 oz (47 kg)  01/16/19 106 lb (48.1 kg)  10/26/18 108 lb (49 kg)    Lab Results  Component Value Date   WBC 6.1  12/11/2017   HGB 12.6 12/11/2017   HCT 37.8 12/11/2017   PLT 258.0 12/11/2017   GLUCOSE 93 12/11/2017   CHOL 150 10/23/2009   TRIG 110.0 10/23/2009   HDL 51.00 10/23/2009   LDLCALC 77 10/23/2009   ALT 9 10/21/2015   AST 16 10/21/2015   NA 143 12/11/2017   K 4.0 12/11/2017   CL 108 12/11/2017   CREATININE 1.02 12/11/2017   BUN 14 12/11/2017   CO2 27 12/11/2017   TSH 0.85 12/11/2017    Assessment/Plan:  Essential hypertension  -Elevated -Continue lifestyle modifications -Continue lisinopril 40 mg.  We will add Norvasc 2.5 mg -Patient strongly encouraged to check BP at home and keep a log to bring with her to clinic. - Plan: amLODipine (NORVASC) 2.5 MG tablet  Acute maxillary sinusitis, recurrence not specified  -Supportive care.  Okay to use Flonase - Plan: amoxicillin (AMOXIL) 500 MG tablet  TOBACCO USE -Smoking cessation counseling greater than 3 minutes, less than 10 minutes -Patient smoking 10 cigarettes/day -Patient encouraged to continue cutting down -Consider medication options -Given handouts -We will reevaluate at each visit  F/u in 1 month for HTN  Abbe Amsterdam, MD

## 2019-10-17 NOTE — Patient Instructions (Addendum)
Managing Your Hypertension Hypertension is commonly called high blood pressure. This is when the force of your blood pressing against the walls of your arteries is too strong. Arteries are blood vessels that carry blood from your heart throughout your body. Hypertension forces the heart to work harder to pump blood, and may cause the arteries to become narrow or stiff. Having untreated or uncontrolled hypertension can cause heart attack, stroke, kidney disease, and other problems. What are blood pressure readings? A blood pressure reading consists of a higher number over a lower number. Ideally, your blood pressure should be below 120/80. The first ("top") number is called the systolic pressure. It is a measure of the pressure in your arteries as your heart beats. The second ("bottom") number is called the diastolic pressure. It is a measure of the pressure in your arteries as the heart relaxes. What does my blood pressure reading mean? Blood pressure is classified into four stages. Based on your blood pressure reading, your health care provider may use the following stages to determine what type of treatment you need, if any. Systolic pressure and diastolic pressure are measured in a unit called mm Hg. Normal  Systolic pressure: below 120.  Diastolic pressure: below 80. Elevated  Systolic pressure: 120-129.  Diastolic pressure: below 80. Hypertension stage 1  Systolic pressure: 130-139.  Diastolic pressure: 80-89. Hypertension stage 2  Systolic pressure: 140 or above.  Diastolic pressure: 90 or above. What health risks are associated with hypertension? Managing your hypertension is an important responsibility. Uncontrolled hypertension can lead to:  A heart attack.  A stroke.  A weakened blood vessel (aneurysm).  Heart failure.  Kidney damage.  Eye damage.  Metabolic syndrome.  Memory and concentration problems. What changes can I make to manage my  hypertension? Hypertension can be managed by making lifestyle changes and possibly by taking medicines. Your health care provider will help you make a plan to bring your blood pressure within a normal range. Eating and drinking   Eat a diet that is high in fiber and potassium, and low in salt (sodium), added sugar, and fat. An example eating plan is called the DASH (Dietary Approaches to Stop Hypertension) diet. To eat this way: ? Eat plenty of fresh fruits and vegetables. Try to fill half of your plate at each meal with fruits and vegetables. ? Eat whole grains, such as whole wheat pasta, brown rice, or whole grain bread. Fill about one quarter of your plate with whole grains. ? Eat low-fat diary products. ? Avoid fatty cuts of meat, processed or cured meats, and poultry with skin. Fill about one quarter of your plate with lean proteins such as fish, chicken without skin, beans, eggs, and tofu. ? Avoid premade and processed foods. These tend to be higher in sodium, added sugar, and fat.  Reduce your daily sodium intake. Most people with hypertension should eat less than 1,500 mg of sodium a day.  Limit alcohol intake to no more than 1 drink a day for nonpregnant women and 2 drinks a day for men. One drink equals 12 oz of beer, 5 oz of wine, or 1 oz of hard liquor. Lifestyle  Work with your health care provider to maintain a healthy body weight, or to lose weight. Ask what an ideal weight is for you.  Get at least 30 minutes of exercise that causes your heart to beat faster (aerobic exercise) most days of the week. Activities may include walking, swimming, or biking.  Include exercise   to strengthen your muscles (resistance exercise), such as weight lifting, as part of your weekly exercise routine. Try to do these types of exercises for 30 minutes at least 3 days a week.  Do not use any products that contain nicotine or tobacco, such as cigarettes and e-cigarettes. If you need help quitting,  ask your health care provider.  Control any long-term (chronic) conditions you have, such as high cholesterol or diabetes. Monitoring  Monitor your blood pressure at home as told by your health care provider. Your personal target blood pressure may vary depending on your medical conditions, your age, and other factors.  Have your blood pressure checked regularly, as often as told by your health care provider. Working with your health care provider  Review all the medicines you take with your health care provider because there may be side effects or interactions.  Talk with your health care provider about your diet, exercise habits, and other lifestyle factors that may be contributing to hypertension.  Visit your health care provider regularly. Your health care provider can help you create and adjust your plan for managing hypertension. Will I need medicine to control my blood pressure? Your health care provider may prescribe medicine if lifestyle changes are not enough to get your blood pressure under control, and if:  Your systolic blood pressure is 130 or higher.  Your diastolic blood pressure is 80 or higher. Take medicines only as told by your health care provider. Follow the directions carefully. Blood pressure medicines must be taken as prescribed. The medicine does not work as well when you skip doses. Skipping doses also puts you at risk for problems. Contact a health care provider if:  You think you are having a reaction to medicines you have taken.  You have repeated (recurrent) headaches.  You feel dizzy.  You have swelling in your ankles.  You have trouble with your vision. Get help right away if:  You develop a severe headache or confusion.  You have unusual weakness or numbness, or you feel faint.  You have severe pain in your chest or abdomen.  You vomit repeatedly.  You have trouble breathing. Summary  Hypertension is when the force of blood pumping  through your arteries is too strong. If this condition is not controlled, it may put you at risk for serious complications.  Your personal target blood pressure may vary depending on your medical conditions, your age, and other factors. For most people, a normal blood pressure is less than 120/80.  Hypertension is managed by lifestyle changes, medicines, or both. Lifestyle changes include weight loss, eating a healthy, low-sodium diet, exercising more, and limiting alcohol. This information is not intended to replace advice given to you by your health care provider. Make sure you discuss any questions you have with your health care provider. Document Revised: 11/23/2018 Document Reviewed: 06/29/2016 Elsevier Patient Education  2020 [employer]  Sinusitis, Adult Sinusitis is soreness and swelling (inflammation) of your sinuses. Sinuses are hollow spaces in the bones around your face. They are located:  Around your eyes.  In the middle of your forehead.  Behind your nose.  In your cheekbones. Your sinuses and nasal passages are lined with a fluid called mucus. Mucus drains out of your sinuses. Swelling can trap mucus in your sinuses. This lets germs (bacteria, virus, or fungus) grow, which leads to infection. Most of the time, this condition is caused by a virus. What are the causes? This condition is caused by:  Allergies.  Asthma.  Germs.  Things that block your nose or sinuses.  Growths in the nose (nasal polyps).  Chemicals or irritants in the air.  Fungus (rare). What increases the risk? You are more likely to develop this condition if:  You have a weak body defense system (immune system).  You do a lot of swimming or diving.  You use nasal sprays too much.  You smoke. What are the signs or symptoms? The main symptoms of this condition are pain and a feeling of pressure around the sinuses. Other symptoms include:  Stuffy nose (congestion).  Runny nose  (drainage).  Swelling and warmth in the sinuses.  Headache.  Toothache.  A cough that may get worse at night.  Mucus that collects in the throat or the back of the nose (postnasal drip).  Being unable to smell and taste.  Being very tired (fatigue).  A fever.  Sore throat.  Bad breath. How is this diagnosed? This condition is diagnosed based on:  Your symptoms.  Your medical history.  A physical exam.  Tests to find out if your condition is short-term (acute) or long-term (chronic). Your doctor may: ? Check your nose for growths (polyps). ? Check your sinuses using a tool that has a light (endoscope). ? Check for allergies or germs. ? Do imaging tests, such as an MRI or CT scan. How is this treated? Treatment for this condition depends on the cause and whether it is short-term or long-term.  If caused by a virus, your symptoms should go away on their own within 10 days. You may be given medicines to relieve symptoms. They include: ? Medicines that shrink swollen tissue in the nose. ? Medicines that treat allergies (antihistamines). ? A spray that treats swelling of the nostrils. ? Rinses that help get rid of thick mucus in your nose (nasal saline washes).  If caused by bacteria, your doctor may wait to see if you will get better without treatment. You may be given antibiotic medicine if you have: ? A very bad infection. ? A weak body defense system.  If caused by growths in the nose, you may need to have surgery. Follow these instructions at home: Medicines  Take, use, or apply over-the-counter and prescription medicines only as told by your doctor. These may include nasal sprays.  If you were prescribed an antibiotic medicine, take it as told by your doctor. Do not stop taking the antibiotic even if you start to feel better. Hydrate and humidify   Drink enough water to keep your pee (urine) pale yellow.  Use a cool mist humidifier to keep the humidity  level in your home above 50%.  Breathe in steam for 10-15 minutes, 3-4 times a day, or as told by your doctor. You can do this in the bathroom while a hot shower is running.  Try not to spend time in cool or dry air. Rest  Rest as much as you can.  Sleep with your head raised (elevated).  Make sure you get enough sleep each night. General instructions   Put a warm, moist washcloth on your face 3-4 times a day, or as often as told by your doctor. This will help with discomfort.  Wash your hands often with soap and water. If there is no soap and water, use hand sanitizer.  Do not smoke. Avoid being around people who are smoking (secondhand smoke).  Keep all follow-up visits as told by your doctor. This is important. Contact a doctor if:  You have a fever.  Your symptoms get worse.  Your symptoms do not get better within 10 days. Get help right away if:  You have a very bad headache.  You cannot stop throwing up (vomiting).  You have very bad pain or swelling around your face or eyes.  You have trouble seeing.  You feel confused.  Your neck is stiff.  You have trouble breathing. Summary  Sinusitis is swelling of your sinuses. Sinuses are hollow spaces in the bones around your face.  This condition is caused by tissues in your nose that become inflamed or swollen. This traps germs. These can lead to infection.  If you were prescribed an antibiotic medicine, take it as told by your doctor. Do not stop taking it even if you start to feel better.  Keep all follow-up visits as told by your doctor. This is important. This information is not intended to replace advice given to you by your health care provider. Make sure you discuss any questions you have with your health care provider. Document Revised: 01/01/2018 Document Reviewed: 01/01/2018 Elsevier Patient Education  2020 [employer] Inc.  Tobacco Use Disorder Tobacco use disorder (TUD) occurs when a person craves,  seeks, and uses tobacco, regardless of the consequences. This disorder can cause problems with mental and physical health. It can affect your ability to have healthy relationships, and it can keep you from meeting your responsibilities at work, home, or school. Tobacco may be:  Smoked as a cigarette or cigar.  Inhaled using e-cigarettes.  Smoked in a pipe or hookah.  Chewed as smokeless tobacco.  Inhaled into the nostrils as snuff. Tobacco products contain a dangerous chemical called nicotine, which is very addictive. Nicotine triggers hormones that make the body feel stimulated and works on areas of the brain that make you feel good. These effects can make it hard for people to quit nicotine. Tobacco contains many other unsafe chemicals that can damage almost every organ in the body. Smoking tobacco also puts others in danger due to fire risk and possible health problems caused by breathing in secondhand smoke. What are the signs or symptoms? Symptoms of TUD may include:  Being unable to slow down or stop your tobacco use.  Spending an abnormal amount of time getting or using tobacco.  Craving tobacco. Cravings may last for up to 6 months after quitting.  Tobacco use that: ? Interferes with your work, school, or home life. ? Interferes with your personal and social relationships. ? Makes you give up activities that you once enjoyed or found important.  Using tobacco even though you know that it is: ? Dangerous or bad for your health or someone else's health. ? Causing problems in your life.  Needing more and more of the substance to get the same effect (developing tolerance).  Experiencing unpleasant symptoms if you do not use the substance (withdrawal). Withdrawal symptoms may include: ? Depressed, anxious, or irritable mood. ? Difficulty concentrating. ? Increased appetite. ? Restlessness or trouble sleeping.  Using the substance to avoid withdrawal. How is this  diagnosed? This condition may be diagnosed based on:  Your current and past tobacco use. Your health care provider may ask questions about how your tobacco use affects your life.  A physical exam. You may be diagnosed with TUD if you have at least two symptoms within a 45-month period. How is this treated? This condition is treated by stopping tobacco use. Many people are unable to quit on their own and  need help. Treatment may include:  Nicotine replacement therapy (NRT). NRT provides nicotine without the other harmful chemicals in tobacco. NRT gradually lowers the dosage of nicotine in the body and reduces withdrawal symptoms. NRT is available as: ? Over-the-counter gums, lozenges, and skin patches. ? Prescription mouth inhalers and nasal sprays.  Medicine that acts on the brain to reduce cravings and withdrawal symptoms.  A type of talk therapy that examines your triggers for tobacco use, how to avoid them, and how to cope with cravings (behavioral therapy).  Hypnosis. This may help with withdrawal symptoms.  Joining a support group for others coping with TUD. The best treatment for TUD is usually a combination of medicine, talk therapy, and support groups. Recovery can be a long process. Many people start using tobacco again after stopping (relapse). If you relapse, it does not mean that treatment will not work. Follow these instructions at home:  Lifestyle  Do not use any products that contain nicotine or tobacco, such as cigarettes and e-cigarettes.  Avoid things that trigger tobacco use as much as you can. Triggers include people and situations that usually cause you to use tobacco.  Avoid drinks that contain caffeine, including coffee. These may worsen some withdrawal symptoms.  Find ways to manage stress. Wanting to smoke may cause stress, and stress can make you want to smoke. Relaxation techniques such as deep breathing, meditation, and yoga may help.  Attend support  groups as needed. These groups are an important part of long-term recovery for many people. General instructions  Take over-the-counter and prescription medicines only as told by your health care provider.  Check with your health care provider before taking any new prescription or over-the-counter medicines.  Decide on a friend, family member, or smoking quit-line (such as 1-800-QUIT-NOW in the [place] that you can call or text when you feel the urge to smoke or when you need help coping with cravings.  Keep all follow-up visits as told by your health care provider and therapist. This is important. Contact a health care provider if:  You are not able to take your medicines as prescribed.  Your symptoms get worse, even with treatment. Summary  Tobacco use disorder (TUD) occurs when a person craves, seeks, and uses tobacco regardless of the consequences.  This condition may be diagnosed based on your current and past tobacco use and a physical exam.  Many people are unable to quit on their own and need help. Recovery can be a long process.  The most effective treatment for TUD is usually a combination of medicine, talk therapy, and support groups. This information is not intended to replace advice given to you by your health care provider. Make sure you discuss any questions you have with your health care provider. Document Revised: 07/19/2017 Document Reviewed: 07/19/2017 Elsevier Patient Education  2020 [employer].

## 2019-10-18 ENCOUNTER — Encounter: Payer: Self-pay | Admitting: Family Medicine

## 2019-11-06 ENCOUNTER — Telehealth: Payer: Self-pay | Admitting: Family Medicine

## 2019-11-06 ENCOUNTER — Other Ambulatory Visit: Payer: Self-pay | Admitting: Family Medicine

## 2019-11-06 MED ORDER — DOXYCYCLINE HYCLATE 100 MG PO TABS: 100.0000 mg | ORAL_TABLET | Freq: Two times a day (BID) | ORAL | 0 refills | Status: AC

## 2019-11-06 NOTE — Telephone Encounter (Signed)
Rx for doxycycline sent to pharmacy.  

## 2019-11-06 NOTE — Telephone Encounter (Signed)
Pt states that the antibiotic [facility] prescribe is not working, she took the whole bottle and she still has the sinus infection and her head still hurts. She is wondering if she can call in a different antibiotic.   Pharmacy: [facility] [id number]  Pt can be reached at [phone]

## 2019-11-06 NOTE — Telephone Encounter (Signed)
Clinic RN spoke with patient. Patient reports she still has a headache especially on the right side of temple and  her nose is burning. Coughing with yellow sputum and denies fever. Patient reports she has completed amoxicillin and is using Flonase. Patient " I just want cut my head off it hurts so bad."

## 2019-11-20 ENCOUNTER — Other Ambulatory Visit: Payer: Self-pay

## 2019-11-21 ENCOUNTER — Ambulatory Visit (INDEPENDENT_AMBULATORY_CARE_PROVIDER_SITE_OTHER): Payer: BC Managed Care – PPO | Admitting: Family Medicine

## 2019-11-21 ENCOUNTER — Encounter: Payer: Self-pay | Admitting: Family Medicine

## 2019-11-21 VITALS — BP 132/70 | HR 90 | Temp 98.3°F | Wt 102.0 lb

## 2019-11-21 DIAGNOSIS — J0110 Acute frontal sinusitis, unspecified: Secondary | ICD-10-CM | POA: Diagnosis not present

## 2019-11-21 DIAGNOSIS — J302 Other seasonal allergic rhinitis: Secondary | ICD-10-CM

## 2019-11-21 DIAGNOSIS — I10 Essential (primary) hypertension: Secondary | ICD-10-CM

## 2019-11-21 MED ORDER — CETIRIZINE HCL 10 MG PO TABS: 10.0000 mg | ORAL_TABLET | Freq: Every day | ORAL | 11 refills | Status: DC

## 2019-11-21 MED ORDER — AZITHROMYCIN 250 MG PO TABS: ORAL_TABLET | ORAL | 0 refills | Status: DC

## 2019-11-21 NOTE — Progress Notes (Signed)
Subjective:    Patient ID: Erika Tucker, female    DOB: 01/10/1946, 74 y.o.   MRN: [id number]  No chief complaint on file.   HPI Patient was seen today for follow-up and acute concern.  Pt endorses BP has been good.  She is not checking it at home.  Denies dizziness, headache, changes in vision, lower extremity edema, dry cough on lisinopril 40 mg and Norvasc 2.5 mg.  Pt does not cook at home.  Pt notes increased allergy symptoms-itchy nose, facial pressure, HAs.  Using Flonase.  Treated for sinusitis in late March with amoxicillin and doxycycline.  Pt states her symptoms resolved but has since returned.  Pt endorses headache, facial pressure/pain since last week.  Pt celebrated her birthday yesterday.  States her kids got her a cake.  Has a pic of the cake that looks like an Surveyor, mining as pt likes to shop on [place].  Past Medical History:  Diagnosis Date  . GERD (gastroesophageal reflux disease)   . Nephrolithiasis   . Wrist tendonitis     Allergies  Allergen Reactions  . Aspirin     REACTION: GI Intolerance    ROS General: Denies fever, chills, night sweats, changes in weight, changes in appetite HEENT: Denies ear pain, changes in vision, rhinorrhea, sore throat  +itchy nose, HA, facial pressure/pain CV: Denies CP, palpitations, SOB, orthopnea Pulm: Denies SOB, cough, wheezing GI: Denies abdominal pain, nausea, vomiting, diarrhea, constipation GU: Denies dysuria, hematuria, frequency, vaginal discharge Msk: Denies muscle cramps, joint pains Neuro: Denies weakness, numbness, tingling Skin: Denies rashes, bruising Psych: Denies depression, anxiety, hallucinations     Objective:    Blood pressure 132/70, pulse 90, temperature 98.3 F (36.8 C), temperature source Temporal, weight 102 lb (46.3 kg), SpO2 98 %.  Gen. Pleasant, well-nourished, in no distress, normal affect   HEENT: [place]/AT, face symmetric, conjunctiva clear, no scleral icterus, PERRLA, EOMI, nares patent  without drainage, pharynx without erythema or exudate.  TTP of R frontal sinus.  TMs full b/l, without erythema or purulence. Lungs: no accessory muscle use, CTAB, no wheezes or rales Cardiovascular: RRR, no m/r/g, no peripheral edema Neuro:  A&Ox3, CN II-XII intact, normal gait Skin:  Warm, no lesions/ rash  Wt Readings from Last 3 Encounters:  11/21/19 102 lb (46.3 kg)  10/17/19 103 lb (46.7 kg)  03/28/19 103 lb 9.6 oz (47 kg)    Lab Results  Component Value Date   WBC 6.1 12/11/2017   HGB 12.6 12/11/2017   HCT 37.8 12/11/2017   PLT 258.0 12/11/2017   GLUCOSE 93 12/11/2017   CHOL 150 10/23/2009   TRIG 110.0 10/23/2009   HDL 51.00 10/23/2009   LDLCALC 77 10/23/2009   ALT 9 10/21/2015   AST 16 10/21/2015   NA 143 12/11/2017   K 4.0 12/11/2017   CL 108 12/11/2017   CREATININE 1.02 12/11/2017   BUN 14 12/11/2017   CO2 27 12/11/2017   TSH 0.85 12/11/2017    Assessment/Plan:  Essential hypertension -Improving -Discussed lifestyle modifications -Continue current medications including Norvasc 2.5 mg and lisinopril 40 mg -Given precautions -If needed can increase Norvasc to 5 mg for BP greater than 130/90 -Follow-up in the next few months for BP check.  Acute frontal sinusitis, recurrence not specified  -Supportive care -Continue Flonase.  Will start Zyrtec for better allergy control. - Plan: azithromycin (ZITHROMAX) 250 MG tablet  Seasonal allergic rhinitis, unspecified trigger  -Continue Flonase -Will start Zyrtec. - Plan: cetirizine (ZYRTEC) 10 MG tablet  F/u in the next 1-2 months for HTN.  Pt to schedule CPE.  Grier Mitts, MD

## 2019-11-21 NOTE — Patient Instructions (Signed)
Sinusitis, Adult Sinusitis is soreness and swelling (inflammation) of your sinuses. Sinuses are hollow spaces in the bones around your face. They are located:  Around your eyes.  In the middle of your forehead.  Behind your nose.  In your cheekbones. Your sinuses and nasal passages are lined with a fluid called mucus. Mucus drains out of your sinuses. Swelling can trap mucus in your sinuses. This lets germs (bacteria, virus, or fungus) grow, which leads to infection. Most of the time, this condition is caused by a virus. What are the causes? This condition is caused by:  Allergies.  Asthma.  Germs.  Things that block your nose or sinuses.  Growths in the nose (nasal polyps).  Chemicals or irritants in the air.  Fungus (rare). What increases the risk? You are more likely to develop this condition if:  You have a weak body defense system (immune system).  You do a lot of swimming or diving.  You use nasal sprays too much.  You smoke. What are the signs or symptoms? The main symptoms of this condition are pain and a feeling of pressure around the sinuses. Other symptoms include:  Stuffy nose (congestion).  Runny nose (drainage).  Swelling and warmth in the sinuses.  Headache.  Toothache.  A cough that may get worse at night.  Mucus that collects in the throat or the back of the nose (postnasal drip).  Being unable to smell and taste.  Being very tired (fatigue).  A fever.  Sore throat.  Bad breath. How is this diagnosed? This condition is diagnosed based on:  Your symptoms.  Your medical history.  A physical exam.  Tests to find out if your condition is short-term (acute) or long-term (chronic). Your doctor may: ? Check your nose for growths (polyps). ? Check your sinuses using a tool that has a light (endoscope). ? Check for allergies or germs. ? Do imaging tests, such as an MRI or CT scan. How is this treated? Treatment for this condition  depends on the cause and whether it is short-term or long-term.  If caused by a virus, your symptoms should go away on their own within 10 days. You may be given medicines to relieve symptoms. They include: ? Medicines that shrink swollen tissue in the nose. ? Medicines that treat allergies (antihistamines). ? A spray that treats swelling of the nostrils. ? Rinses that help get rid of thick mucus in your nose (nasal saline washes).  If caused by bacteria, your doctor may wait to see if you will get better without treatment. You may be given antibiotic medicine if you have: ? A very bad infection. ? A weak body defense system.  If caused by growths in the nose, you may need to have surgery. Follow these instructions at home: Medicines  Take, use, or apply over-the-counter and prescription medicines only as told by your doctor. These may include nasal sprays.  If you were prescribed an antibiotic medicine, take it as told by your doctor. Do not stop taking the antibiotic even if you start to feel better. Hydrate and humidify   Drink enough water to keep your pee (urine) pale yellow.  Use a cool mist humidifier to keep the humidity level in your home above 50%.  Breathe in steam for 10-15 minutes, 3-4 times a day, or as told by your doctor. You can do this in the bathroom while a hot shower is running.  Try not to spend time in cool or dry air.   Rest  Rest as much as you can.  Sleep with your head raised (elevated).  Make sure you get enough sleep each night. General instructions   Put a warm, moist washcloth on your face 3-4 times a day, or as often as told by your doctor. This will help with discomfort.  Wash your hands often with soap and water. If there is no soap and water, use hand sanitizer.  Do not smoke. Avoid being around people who are smoking (secondhand smoke).  Keep all follow-up visits as told by your doctor. This is important. Contact a doctor if:  You  have a fever.  Your symptoms get worse.  Your symptoms do not get better within 10 days. Get help right away if:  You have a very bad headache.  You cannot stop throwing up (vomiting).  You have very bad pain or swelling around your face or eyes.  You have trouble seeing.  You feel confused.  Your neck is stiff.  You have trouble breathing. Summary  Sinusitis is swelling of your sinuses. Sinuses are hollow spaces in the bones around your face.  This condition is caused by tissues in your nose that become inflamed or swollen. This traps germs. These can lead to infection.  If you were prescribed an antibiotic medicine, take it as told by your doctor. Do not stop taking it even if you start to feel better.  Keep all follow-up visits as told by your doctor. This is important. This information is not intended to replace advice given to you by your health care provider. Make sure you discuss any questions you have with your health care provider. Document Revised: 01/01/2018 Document Reviewed: 01/01/2018 Elsevier Patient Education  [place] Your Hypertension Hypertension is commonly called high blood pressure. This is when the force of your blood pressing against the walls of your arteries is too strong. Arteries are blood vessels that carry blood from your heart throughout your body. Hypertension forces the heart to work harder to pump blood, and may cause the arteries to become narrow or stiff. Having untreated or uncontrolled hypertension can cause heart attack, stroke, kidney disease, and other problems. What are blood pressure readings? A blood pressure reading consists of a higher number over a lower number. Ideally, your blood pressure should be below 120/80. The first ("top") number is called the systolic pressure. It is a measure of the pressure in your arteries as your heart beats. The second ("bottom") number is called the diastolic pressure. It is a  measure of the pressure in your arteries as the heart relaxes. What does my blood pressure reading mean? Blood pressure is classified into four stages. Based on your blood pressure reading, your health care provider may use the following stages to determine what type of treatment you need, if any. Systolic pressure and diastolic pressure are measured in a unit called mm Hg. Normal  Systolic pressure: below 631.  Diastolic pressure: below 80. Elevated  Systolic pressure: 497-026.  Diastolic pressure: below 80. Hypertension stage 1  Systolic pressure: 378-588.  Diastolic pressure: 50-27. Hypertension stage 2  Systolic pressure: 741 or above.  Diastolic pressure: 90 or above. What health risks are associated with hypertension? Managing your hypertension is an important responsibility. Uncontrolled hypertension can lead to:  A heart attack.  A stroke.  A weakened blood vessel (aneurysm).  Heart failure.  Kidney damage.  Eye damage.  Metabolic syndrome.  Memory and concentration problems. What changes can I make  to manage my hypertension? Hypertension can be managed by making lifestyle changes and possibly by taking medicines. Your health care provider will help you make a plan to bring your blood pressure within a normal range. Eating and drinking   Eat a diet that is high in fiber and potassium, and low in salt (sodium), added sugar, and fat. An example eating plan is called the DASH (Dietary Approaches to Stop Hypertension) diet. To eat this way: ? Eat plenty of fresh fruits and vegetables. Try to fill half of your plate at each meal with fruits and vegetables. ? Eat whole grains, such as whole wheat pasta, brown rice, or whole grain bread. Fill about one quarter of your plate with whole grains. ? Eat low-fat diary products. ? Avoid fatty cuts of meat, processed or cured meats, and poultry with skin. Fill about one quarter of your plate with lean proteins such as fish,  chicken without skin, beans, eggs, and tofu. ? Avoid premade and processed foods. These tend to be higher in sodium, added sugar, and fat.  Reduce your daily sodium intake. Most people with hypertension should eat less than 1,500 mg of sodium a day.  Limit alcohol intake to no more than 1 drink a day for nonpregnant women and 2 drinks a day for men. One drink equals 12 oz of beer, 5 oz of wine, or 1 oz of hard liquor. Lifestyle  Work with your health care provider to maintain a healthy body weight, or to lose weight. Ask what an ideal weight is for you.  Get at least 30 minutes of exercise that causes your heart to beat faster (aerobic exercise) most days of the week. Activities may include walking, swimming, or biking.  Include exercise to strengthen your muscles (resistance exercise), such as weight lifting, as part of your weekly exercise routine. Try to do these types of exercises for 30 minutes at least 3 days a week.  Do not use any products that contain nicotine or tobacco, such as cigarettes and e-cigarettes. If you need help quitting, ask your health care provider.  Control any long-term (chronic) conditions you have, such as high cholesterol or diabetes. Monitoring  Monitor your blood pressure at home as told by your health care provider. Your personal target blood pressure may vary depending on your medical conditions, your age, and other factors.  Have your blood pressure checked regularly, as often as told by your health care provider. Working with your health care provider  Review all the medicines you take with your health care provider because there may be side effects or interactions.  Talk with your health care provider about your diet, exercise habits, and other lifestyle factors that may be contributing to hypertension.  Visit your health care provider regularly. Your health care provider can help you create and adjust your plan for managing hypertension. Will I need  medicine to control my blood pressure? Your health care provider may prescribe medicine if lifestyle changes are not enough to get your blood pressure under control, and if:  Your systolic blood pressure is 130 or higher.  Your diastolic blood pressure is 80 or higher. Take medicines only as told by your health care provider. Follow the directions carefully. Blood pressure medicines must be taken as prescribed. The medicine does not work as well when you skip doses. Skipping doses also puts you at risk for problems. Contact a health care provider if:  You think you are having a reaction to medicines you have  taken.  You have repeated (recurrent) headaches.  You feel dizzy.  You have swelling in your ankles.  You have trouble with your vision. Get help right away if:  You develop a severe headache or confusion.  You have unusual weakness or numbness, or you feel faint.  You have severe pain in your chest or abdomen.  You vomit repeatedly.  You have trouble breathing. Summary  Hypertension is when the force of blood pumping through your arteries is too strong. If this condition is not controlled, it may put you at risk for serious complications.  Your personal target blood pressure may vary depending on your medical conditions, your age, and other factors. For most people, a normal blood pressure is less than 120/80.  Hypertension is managed by lifestyle changes, medicines, or both. Lifestyle changes include weight loss, eating a healthy, low-sodium diet, exercising more, and limiting alcohol. This information is not intended to replace advice given to you by your health care provider. Make sure you discuss any questions you have with your health care provider. Document Revised: 11/23/2018 Document Reviewed: 06/29/2016 Elsevier Patient Education  2020 [employer].  Allergic Rhinitis, Adult Allergic rhinitis is a reaction to allergens in the air. Allergens are tiny specks  (particles) in the air that cause your body to have an allergic reaction. This condition cannot be passed from person to person (is not contagious). Allergic rhinitis cannot be cured, but it can be controlled. There are two types of allergic rhinitis:  Seasonal. This type is also called hay fever. It happens only during certain times of the year.  Perennial. This type can happen at any time of the year. What are the causes? This condition may be caused by:  Pollen from grasses, trees, and weeds.  House dust mites.  Pet dander.  Mold. What are the signs or symptoms? Symptoms of this condition include:  Sneezing.  Runny or stuffy nose (nasal congestion).  A lot of mucus in the back of the throat (postnasal drip).  Itchy nose.  Tearing of the eyes.  Trouble sleeping.  Being sleepy during day. How is this treated? There is no cure for this condition. You should avoid things that trigger your symptoms (allergens). Treatment can help to relieve symptoms. This may include:  Medicines that block allergy symptoms, such as antihistamines. These may be given as a shot, nasal spray, or pill.  Shots that are given until your body becomes less sensitive to the allergen (desensitization).  Stronger medicines, if all other treatments have not worked. Follow these instructions at home: Avoiding allergens   Find out what you are allergic to. Common allergens include smoke, dust, and pollen.  Avoid them if you can. These are some of the things that you can do to avoid allergens: ? Replace carpet with wood, tile, or vinyl flooring. Carpet can trap dander and dust. ? Clean any mold found in the home. ? Do not smoke. Do not allow smoking in your home. ? Change your heating and air conditioning filter at least once a month. ? During allergy season:  Keep windows closed as much as you can. If possible, use air conditioning when there is a lot of pollen in the air.  Use a special filter  for allergies with your furnace and air conditioner.  Plan outdoor activities when pollen counts are lowest. This is usually during the early morning or evening hours.  If you do go outdoors when pollen count is high, wear a special mask  for people with allergies.  When you come indoors, take a shower and change your clothes before sitting on furniture or bedding. General instructions  Do not use fans in your home.  Do not hang clothes outside to dry.  Wear sunglasses to keep pollen out of your eyes.  Wash your hands right away after you touch household pets.  Take over-the-counter and prescription medicines only as told by your doctor.  Keep all follow-up visits as told by your doctor. This is important. Contact a doctor if:  You have a fever.  You have a cough that does not go away (is persistent).  You start to make whistling sounds when you breathe (wheeze).  Your symptoms do not get better with treatment.  You have thick fluid coming from your nose.  You start to have nosebleeds. Get help right away if:  Your tongue or your lips are swollen.  You have trouble breathing.  You feel dizzy or you feel like you are going to pass out (faint).  You have cold sweats. Summary  Allergic rhinitis is a reaction to allergens in the air.  This condition may be caused by allergens. These include pollen, dust mites, pet dander, and mold.  Symptoms include a runny, itchy nose, sneezing, or tearing eyes. You may also have trouble sleeping or feel sleepy during the day.  Treatment includes taking medicines and avoiding allergens. You may also get shots or take stronger medicines.  Get help if you have a fever or a cough that does not stop. Get help right away if you are short of breath. This information is not intended to replace advice given to you by your health care provider. Make sure you discuss any questions you have with your health care provider. Document Revised:  11/20/2018 Document Reviewed: 02/20/2018 Elsevier Patient Education  2020 [employer].

## 2019-11-23 ENCOUNTER — Encounter: Payer: Self-pay | Admitting: Family Medicine

## 2020-01-08 ENCOUNTER — Other Ambulatory Visit: Payer: Self-pay | Admitting: Family Medicine

## 2020-01-08 DIAGNOSIS — I10 Essential (primary) hypertension: Secondary | ICD-10-CM

## 2020-01-14 ENCOUNTER — Other Ambulatory Visit: Payer: Self-pay | Admitting: Family Medicine

## 2020-01-14 DIAGNOSIS — K219 Gastro-esophageal reflux disease without esophagitis: Secondary | ICD-10-CM

## 2020-01-14 DIAGNOSIS — I10 Essential (primary) hypertension: Secondary | ICD-10-CM

## 2020-03-16 ENCOUNTER — Other Ambulatory Visit: Payer: Self-pay

## 2020-03-16 ENCOUNTER — Encounter: Payer: Self-pay | Admitting: Family Medicine

## 2020-03-16 ENCOUNTER — Ambulatory Visit: Payer: BC Managed Care – PPO | Admitting: Family Medicine

## 2020-03-16 VITALS — BP 138/82 | HR 89 | Temp 98.5°F | Wt 105.0 lb

## 2020-03-16 DIAGNOSIS — N200 Calculus of kidney: Secondary | ICD-10-CM

## 2020-03-16 DIAGNOSIS — R300 Dysuria: Secondary | ICD-10-CM | POA: Diagnosis not present

## 2020-03-16 LAB — POC URINALSYSI DIPSTICK (AUTOMATED)
Bilirubin, UA: NEGATIVE
Glucose, UA: NEGATIVE
Ketones, UA: NEGATIVE
Leukocytes, UA: NEGATIVE
Nitrite, UA: NEGATIVE
Protein, UA: POSITIVE — AB
Spec Grav, UA: >=1.03 — AB (ref 1.010–1.025)
Urobilinogen, UA: 1 E.U./dL
pH, UA: 6 (ref 5.0–8.0)

## 2020-03-16 MED ORDER — TRAMADOL HCL 50 MG PO TABS: 50.0000 mg | ORAL_TABLET | Freq: Three times a day (TID) | ORAL | 0 refills | Status: AC | PRN

## 2020-03-16 MED ORDER — TAMSULOSIN HCL 0.4 MG PO CAPS: 0.4000 mg | ORAL_CAPSULE | Freq: Every day | ORAL | 0 refills | Status: DC

## 2020-03-16 NOTE — Patient Instructions (Signed)
Dietary Guidelines to Help Prevent Kidney Stones Kidney stones are deposits of minerals and salts that form inside your kidneys. Your risk of developing kidney stones may be greater depending on your diet, your lifestyle, the medicines you take, and whether you have certain medical conditions. Most people can reduce their chances of developing kidney stones by following the instructions below. Depending on your overall health and the type of kidney stones you tend to develop, your dietitian may give you more specific instructions. What are tips for following this plan? Reading food labels  Choose foods with "no salt added" or "low-salt" labels. Limit your sodium intake to less than 1500 mg per day.  Choose foods with calcium for each meal and snack. Try to eat about 300 mg of calcium at each meal. Foods that contain 200-500 mg of calcium per serving include: ? 8 oz (237 ml) of milk, fortified nondairy milk, and fortified fruit juice. ? 8 oz (237 ml) of kefir, yogurt, and soy yogurt. ? 4 oz (118 ml) of tofu. ? 1 oz of cheese. ? 1 cup (300 g) of dried figs. ? 1 cup (91 g) of cooked broccoli. ? 1-3 oz can of sardines or mackerel.  Most people need 1000 to 1500 mg of calcium each day. Talk to your dietitian about how much calcium is recommended for you. Shopping  Buy plenty of fresh fruits and vegetables. Most people do not need to avoid fruits and vegetables, even if they contain nutrients that may contribute to kidney stones.  When shopping for convenience foods, choose: ? Whole pieces of fruit. ? Premade salads with dressing on the side. ? Low-fat fruit and yogurt smoothies.  Avoid buying frozen meals or prepared deli foods.  Look for foods with live cultures, such as yogurt and kefir. Cooking  Do not add salt to food when cooking. Place a salt shaker on the table and allow each person to add his or her own salt to taste.  Use vegetable protein, such as beans, textured vegetable  protein (TVP), or tofu instead of meat in pasta, casseroles, and soups. Meal planning   Eat less salt, if told by your dietitian. To do this: ? Avoid eating processed or premade food. ? Avoid eating fast food.  Eat less animal protein, including cheese, meat, poultry, or fish, if told by your dietitian. To do this: ? Limit the number of times you have meat, poultry, fish, or cheese each week. Eat a diet free of meat at least 2 days a week. ? Eat only one serving each day of meat, poultry, fish, or seafood. ? When you prepare animal protein, cut pieces into small portion sizes. For most meat and fish, one serving is about the size of one deck of cards.  Eat at least 5 servings of fresh fruits and vegetables each day. To do this: ? Keep fruits and vegetables on hand for snacks. ? Eat 1 piece of fruit or a handful of berries with breakfast. ? Have a salad and fruit at lunch. ? Have two kinds of vegetables at dinner.  Limit foods that are high in a substance called oxalate. These include: ? Spinach. ? Rhubarb. ? Beets. ? Potato chips and french fries. ? Nuts.  If you regularly take a diuretic medicine, make sure to eat at least 1-2 fruits or vegetables high in potassium each day. These include: ? Avocado. ? Banana. ? Orange, prune, carrot, or tomato juice. ? Baked potato. ? Cabbage. ? Beans and split   peas. °General instructions ° °· Drink enough fluid to keep your urine clear or pale yellow. This is the most important thing you can do. °· Talk to your health care provider and dietitian about taking daily supplements. Depending on your health and the cause of your kidney stones, you may be advised: °? Not to take supplements with vitamin C. °? To take a calcium supplement. °? To take a daily probiotic supplement. °? To take other supplements such as magnesium, fish oil, or vitamin B6. °· Take all medicines and supplements as told by your health care provider. °· Limit alcohol intake to no  more than 1 drink a day for nonpregnant women and 2 drinks a day for men. One drink equals 12 oz of beer, 5 oz of wine, or 1½ oz of hard liquor. °· Lose weight if told by your health care provider. Work with your dietitian to find strategies and an eating plan that works best for you. °What foods are not recommended? °Limit your intake of the following foods, or as told by your dietitian. Talk to your dietitian about specific foods you should avoid based on the type of kidney stones and your overall health. °Grains °Breads. Bagels. Rolls. Baked goods. Salted crackers. Cereal. Pasta. °Vegetables °Spinach. Rhubarb. Beets. Canned vegetables. Pickles. Olives. °Meats and other protein foods °Nuts. Nut butters. Large portions of meat, poultry, or fish. Salted or cured meats. Deli meats. Hot dogs. Sausages. °Dairy °Cheese. °Beverages °Regular soft drinks. Regular vegetable juice. °Seasonings and other foods °Seasoning blends with salt. Salad dressings. Canned soups. Soy sauce. Ketchup. Barbecue sauce. Canned pasta sauce. Casseroles. Pizza. Lasagna. Frozen meals. Potato chips. French fries. °Summary °· You can reduce your risk of kidney stones by making changes to your diet. °· The most important thing you can do is drink enough fluid. You should drink enough fluid to keep your urine clear or pale yellow. °· Ask your health care provider or dietitian how much protein from animal sources you should eat each day, and also how much salt and calcium you should have each day. °This information is not intended to replace advice given to you by your health care provider. Make sure you discuss any questions you have with your health care provider. °Document Revised: 11/21/2018 Document Reviewed: 07/12/2016 °Elsevier Patient Education © 2020 [employer] °Kidney Stones °Kidney stones are rock-like masses that form inside of the kidneys. Kidneys are organs that make pee (urine). A kidney stone may move into other parts of the  urinary tract, including: °· The tubes that connect the kidneys to the bladder (ureters). °· The bladder. °· The tube that carries urine out of the body (urethra). °Kidney stones can cause very bad pain and can block the flow of pee. The stone usually leaves your body (passes) through your pee. You may need to have a doctor take out the stone. °What are the causes? °Kidney stones may be caused by: °· A condition in which certain glands make too much parathyroid hormone (primary hyperparathyroidism). °· A buildup of a type of crystals in the bladder made of a chemical called uric acid. The body makes uric acid when you eat certain foods. °· Narrowing (stricture) of one or both of the ureters. °· A kidney blockage that you were born with. °· Past surgery on the kidney or the ureters, such as gastric bypass surgery. °What increases the risk? °You are more likely to develop this condition if: °· You have had a kidney stone in the   past. °· You have a family history of kidney stones. °· You do not drink enough water. °· You eat a diet that is high in protein, salt (sodium), or sugar. °· You are overweight or very overweight (obese). °What are the signs or symptoms? °Symptoms of a kidney stone may include: °· Pain in the side of the belly, right below the ribs (flank pain). Pain usually spreads (radiates) to the groin. °· Needing to pee often or right away (urgently). °· Pain when going pee (urinating). °· Blood in your pee (hematuria). °· Feeling like you may vomit (nauseous). °· Vomiting. °· Fever and chills. °How is this treated? °Treatment depends on the size, location, and makeup of the kidney stones. The stones will often pass out of the body through peeing. You may need to: °· Drink more fluid to help pass the stone. In some cases, you may be given fluids through an IV tube put into one of your veins at the hospital. °· Take medicine for pain. °· Make changes in your diet to help keep kidney stones from coming  back. °Sometimes, medical procedures are needed to remove a kidney stone. This may involve: °· A procedure to break up kidney stones using a beam of light (laser) or shock waves. °· Surgery to remove the kidney stones. °Follow these instructions at home: °Medicines °· Take over-the-counter and prescription medicines only as told by your doctor. °· Ask your doctor if the medicine prescribed to you requires you to avoid driving or using heavy machinery. °Eating and drinking °· Drink enough fluid to keep your pee pale yellow. You may be told to drink at least 8-10 glasses of water each day. This will help you pass the stone. °· If told by your doctor, change your diet. This may include: °? Limiting how much salt you eat. °? Eating more fruits and vegetables. °? Limiting how much meat, poultry, fish, and eggs you eat. °· Follow instructions from your doctor about eating or drinking restrictions. °General instructions °· Collect pee samples as told by your doctor. You may need to collect a pee sample: °? 24 hours after a stone comes out. °? 8-12 weeks after a stone comes out, and every 6-12 months after that. °· Strain your pee every time you pee (urinate), for as long as told. Use the strainer that your doctor recommends. °· Do not throw out the stone. Keep it so that it can be tested by your doctor. °· Keep all follow-up visits as told by your doctor. This is important. You may need follow-up tests. °How is this prevented? °To prevent another kidney stone: °· Drink enough fluid to keep your pee pale yellow. This is the best way to prevent kidney stones. °· Eat healthy foods. °· Avoid certain foods as told by your doctor. You may be told to eat less protein. °· Stay at a healthy weight. °Where to find more information °· National Kidney Foundation (NKF): www.kidney.org °· [employer] (UCF): www.urologyhealth.org °Contact a doctor if: °· You have pain that gets worse or does not get better with medicine. °Get  help right away if: °· You have a fever or chills. °· You get very bad pain. °· You get new pain in your belly (abdomen). °· You pass out (faint). °· You cannot pee. °Summary °· Kidney stones are rock-like masses that form inside of the kidneys. °· Kidney stones can cause very bad pain and can block the flow of pee. °· The stones will   often pass out of the body through peeing. °· Drink enough fluid to keep your pee pale yellow. °This information is not intended to replace advice given to you by your health care provider. Make sure you discuss any questions you have with your health care provider. °Document Revised: 12/18/2018 Document Reviewed: 12/18/2018 °Elsevier Patient Education © 2020 Elsevier Inc. ° °

## 2020-03-16 NOTE — Addendum Note (Signed)
Addended by: Carola Rhine on: 03/16/2020 03:16 PM   Modules accepted: Orders

## 2020-03-16 NOTE — Progress Notes (Signed)
Subjective:    Patient ID: Erika Tucker, female    DOB: 1946/01/26, 74 y.o.   MRN: [id number]  No chief complaint on file.   HPI Patient was seen today for acute concern. Pt endorses dysuria, urinary frequency, urgency, pressure, pain in R flank that moves x 3 days.  Feeling is similar to when she had kidney stones in the past.  Pt also notes decreased appetite.  Denies fever, chills, nausea, vomiting.  Increase p.o. intake of water and fluids.  Past Medical History:  Diagnosis Date  . GERD (gastroesophageal reflux disease)   . Nephrolithiasis   . Wrist tendonitis     Allergies  Allergen Reactions  . Aspirin     REACTION: GI Intolerance    ROS General: Denies fever, chills, night sweats, changes in weight  + decreased appetite HEENT: Denies headaches, ear pain, changes in vision, rhinorrhea, sore throat CV: Denies CP, palpitations, SOB, orthopnea Pulm: Denies SOB, cough, wheezing GI: Denies nausea, vomiting, diarrhea, constipation  + right-sided flank pain, suprapubic pain GU: Denies hematuria, vaginal discharge  + urinary frequency, dysuria Msk: Denies muscle cramps, joint pains Neuro: Denies weakness, numbness, tingling Skin: Denies rashes, bruising Psych: Denies depression, anxiety, hallucinations     Objective:    Blood pressure 138/82, pulse 89, temperature 98.5 F (36.9 C), temperature source Oral, weight 105 lb (47.6 kg), SpO2 99 %.  Gen. Pleasant, well-nourished, in no distress, normal affect   HEENT: [place]/AT, face symmetric, conjunctiva clear, no scleral icterus, PERRLA, EOMI, nares patent without drainage Lungs: no accessory muscle use, CTAB, no wheezes or rales Cardiovascular: RRR, no m/r/g, no peripheral edema Abdomen: BS present, soft, diffuse TTP. ND, no hepatosplenomegaly.  Negative CVA tenderness but discomfort noted Neuro:  A&Ox3, CN II-XII intact, normal gait Skin:  Warm, no lesions/ rash   Wt Readings from Last 3 Encounters:  11/21/19 102 lb (46.3  kg)  10/17/19 103 lb (46.7 kg)  03/28/19 103 lb 9.6 oz (47 kg)    Lab Results  Component Value Date   WBC 6.1 12/11/2017   HGB 12.6 12/11/2017   HCT 37.8 12/11/2017   PLT 258.0 12/11/2017   GLUCOSE 93 12/11/2017   CHOL 150 10/23/2009   TRIG 110.0 10/23/2009   HDL 51.00 10/23/2009   LDLCALC 77 10/23/2009   ALT 9 10/21/2015   AST 16 10/21/2015   NA 143 12/11/2017   K 4.0 12/11/2017   CL 108 12/11/2017   CREATININE 1.02 12/11/2017   BUN 14 12/11/2017   CO2 27 12/11/2017   TSH 0.85 12/11/2017    Assessment/Plan:  Dysuria  -UA with concentrated urine, 1+ blood, protein - Plan: Urine Culture  Renal calculi  -Symptoms similar to patient's previous episodes. -Discussed increasing intake of fluids -We will give urine strainer -Discussed imaging.  Patient wishes to wait at this time -We will start Flomax and tramadol as needed pain control -For continued or worsening symptoms will obtain CT stone study -Given precautions - Plan: tamsulosin (FLOMAX) 0.4 MG CAPS capsule, traMADol (ULTRAM) 50 MG tablet  F/u as needed  Abbe Amsterdam, MD

## 2020-03-18 ENCOUNTER — Other Ambulatory Visit: Payer: Self-pay | Admitting: Family Medicine

## 2020-03-18 DIAGNOSIS — N200 Calculus of kidney: Secondary | ICD-10-CM

## 2020-03-18 LAB — URINE CULTURE
MICRO NUMBER:: 10776370
SPECIMEN QUALITY:: ADEQUATE

## 2020-03-23 ENCOUNTER — Other Ambulatory Visit: Payer: Self-pay | Admitting: Family Medicine

## 2020-03-23 MED ORDER — AMOXICILLIN-POT CLAVULANATE 500-125 MG PO TABS
1.0000 | ORAL_TABLET | Freq: Two times a day (BID) | ORAL | 0 refills | Status: AC
Start: 2020-03-23 — End: 2020-03-30

## 2020-03-24 ENCOUNTER — Other Ambulatory Visit: Payer: Self-pay | Admitting: Family Medicine

## 2020-03-24 DIAGNOSIS — N200 Calculus of kidney: Secondary | ICD-10-CM

## 2020-03-27 ENCOUNTER — Telehealth: Payer: Self-pay | Admitting: Family Medicine

## 2020-03-27 NOTE — Telephone Encounter (Signed)
Pt stated the antibiotic Dr. Salomon Fick prescribed is giving her diarrhea and wants to know what she can do/take to stop it?   Pt can be reached [phone]

## 2020-03-27 NOTE — Telephone Encounter (Signed)
Please advise 

## 2020-03-31 NOTE — Telephone Encounter (Signed)
Is pt still having diarrhea?  Ok to stop med.

## 2020-04-01 NOTE — Telephone Encounter (Signed)
Spoke with patient and she stated that she has completed the medication and is not having anymore diarrhea. Patient also stated that she believe the medication has caused a yeast infection and would like medication sent to the pharmacy.

## 2020-04-02 ENCOUNTER — Other Ambulatory Visit: Payer: Self-pay | Admitting: Family Medicine

## 2020-04-02 DIAGNOSIS — J302 Other seasonal allergic rhinitis: Secondary | ICD-10-CM

## 2020-04-06 ENCOUNTER — Other Ambulatory Visit: Payer: Self-pay | Admitting: Family Medicine

## 2020-04-06 MED ORDER — FLUCONAZOLE 150 MG PO TABS
150.0000 mg | ORAL_TABLET | Freq: Once | ORAL | 0 refills | Status: AC
Start: 2020-04-06 — End: 2020-04-06

## 2020-04-06 NOTE — Telephone Encounter (Signed)
Diflucan sent to pharmacy.  For continued or worsening symptoms patient should schedule an appointment.

## 2020-04-09 NOTE — Telephone Encounter (Signed)
Spoke with pt state that  she completed taking Antibiotic and she feels better

## 2020-04-13 ENCOUNTER — Other Ambulatory Visit: Payer: Self-pay | Admitting: Family Medicine

## 2020-04-13 DIAGNOSIS — I10 Essential (primary) hypertension: Secondary | ICD-10-CM

## 2020-04-13 DIAGNOSIS — N200 Calculus of kidney: Secondary | ICD-10-CM

## 2020-04-16 ENCOUNTER — Telehealth: Payer: Self-pay | Admitting: Family Medicine

## 2020-04-16 NOTE — Telephone Encounter (Signed)
Spoke with pt pharmacy state that they have refill left and that they will fill Rx and notify pt to pick up

## 2020-04-16 NOTE — Telephone Encounter (Signed)
omeprazole (PRILOSEC) 40 MG capsule  [id number] [place], [place] - [address] Phone:  [phone]  Fax:  [phone]

## 2020-05-04 ENCOUNTER — Other Ambulatory Visit: Payer: Self-pay | Admitting: Family Medicine

## 2020-05-04 DIAGNOSIS — I10 Essential (primary) hypertension: Secondary | ICD-10-CM

## 2020-05-04 DIAGNOSIS — N200 Calculus of kidney: Secondary | ICD-10-CM

## 2020-05-15 ENCOUNTER — Encounter: Payer: BC Managed Care – PPO | Admitting: Family Medicine

## 2020-05-18 ENCOUNTER — Encounter: Payer: BC Managed Care – PPO | Admitting: Family Medicine

## 2020-05-19 ENCOUNTER — Other Ambulatory Visit: Payer: Self-pay | Admitting: Family Medicine

## 2020-05-19 DIAGNOSIS — N200 Calculus of kidney: Secondary | ICD-10-CM

## 2020-05-29 ENCOUNTER — Encounter: Payer: Self-pay | Admitting: Family Medicine

## 2020-05-29 ENCOUNTER — Ambulatory Visit (INDEPENDENT_AMBULATORY_CARE_PROVIDER_SITE_OTHER): Payer: BC Managed Care – PPO | Admitting: Family Medicine

## 2020-05-29 ENCOUNTER — Other Ambulatory Visit: Payer: Self-pay

## 2020-05-29 VITALS — BP 136/80 | HR 81 | Temp 98.7°F | Ht 66.0 in | Wt 105.0 lb

## 2020-05-29 DIAGNOSIS — Z13220 Encounter for screening for lipoid disorders: Secondary | ICD-10-CM

## 2020-05-29 DIAGNOSIS — Z780 Asymptomatic menopausal state: Secondary | ICD-10-CM | POA: Diagnosis not present

## 2020-05-29 DIAGNOSIS — Z0000 Encounter for general adult medical examination without abnormal findings: Secondary | ICD-10-CM

## 2020-05-29 DIAGNOSIS — I10 Essential (primary) hypertension: Secondary | ICD-10-CM | POA: Diagnosis not present

## 2020-05-29 DIAGNOSIS — F17210 Nicotine dependence, cigarettes, uncomplicated: Secondary | ICD-10-CM

## 2020-05-29 DIAGNOSIS — Z23 Encounter for immunization: Secondary | ICD-10-CM | POA: Diagnosis not present

## 2020-05-29 NOTE — Progress Notes (Signed)
Subjective:     Erika Tucker is a 74 y.o. female and is here for a comprehensive physical exam. The patient reports no problems.  Pt doing well.  Denies headache, chest pain, blurred vision.  Smoking 10 cigs per day. Patient states she needs to schedule colonoscopy.  Pt endorses having one colonoscopy over 10 yrs ago.  Mammogram done 09/14/2018.  Social History   Socioeconomic History  . Marital status: Divorced    Spouse name: Not on file  . Number of children: Not on file  . Years of education: Not on file  . Highest education level: Not on file  Occupational History  . Not on file  Tobacco Use  . Smoking status: Current Every Day Smoker    Packs/day: 0.25  . Smokeless tobacco: Never Used  Substance and Sexual Activity  . Alcohol use: Yes  . Drug use: No  . Sexual activity: Not on file  Other Topics Concern  . Not on file  Social History Narrative  . Not on file   Social Determinants of Health   Financial Resource Strain:   . Difficulty of Paying Living Expenses: Not on file  Food Insecurity:   . Worried About Programme researcher, broadcasting/film/video in the Last Year: Not on file  . Ran Out of Food in the Last Year: Not on file  Transportation Needs:   . Lack of Transportation (Medical): Not on file  . Lack of Transportation (Non-Medical): Not on file  Physical Activity:   . Days of Exercise per Week: Not on file  . Minutes of Exercise per Session: Not on file  Stress:   . Feeling of Stress : Not on file  Social Connections:   . Frequency of Communication with Friends and Family: Not on file  . Frequency of Social Gatherings with Friends and Family: Not on file  . Attends Religious Services: Not on file  . Active Member of Clubs or Organizations: Not on file  . Attends Banker Meetings: Not on file  . Marital Status: Not on file  Intimate Partner Violence:   . Fear of Current or Ex-Partner: Not on file  . Emotionally Abused: Not on file  . Physically Abused: Not on  file  . Sexually Abused: Not on file   Health Maintenance  Topic Date Due  . Hepatitis C Screening  Never done  . COVID-19 Vaccine (1) Never done  . DEXA SCAN  Never done  . COLONOSCOPY  12/02/2018  . PNA vac Low Risk Adult (2 of 2 - PPSV23) 12/12/2018  . INFLUENZA VACCINE  03/15/2020  . MAMMOGRAM  09/14/2020  . TETANUS/TDAP  12/12/2027    The following portions of the patient's history were reviewed and updated as appropriate: allergies, current medications, past family history, past medical history, past social history, past surgical history and problem list.  Review of Systems A comprehensive review of systems was negative.   Objective:    BP 136/80 (BP Location: Left Arm, Patient Position: Sitting, Cuff Size: Normal)   Pulse 81   Temp 98.7 F (37.1 C) (Oral)   Ht 5\' 6"  (1.676 m)   Wt 105 lb (47.6 kg)   SpO2 99%   BMI 16.95 kg/m  General appearance: alert, cooperative and no distress Head: Normocephalic, without obvious abnormality, atraumatic Eyes: conjunctivae/corneas clear. PERRL, EOM's intact. Fundi benign. Ears: normal TM's and external ear canals both ears Nose: Nares normal. Septum midline. Mucosa normal. No drainage or sinus tenderness. Throat: lips, mucosa,  and tongue normal; teeth and gums normal Neck: no adenopathy, no carotid bruit, no JVD, supple, symmetrical, trachea midline and thyroid not enlarged, symmetric, no tenderness/mass/nodules Lungs: clear to auscultation bilaterally Heart: regular rate and rhythm, S1, S2 normal, no murmur, click, rub or gallop Abdomen: soft, non-tender; bowel sounds normal; no masses,  no organomegaly Extremities: extremities normal, atraumatic, no cyanosis or edema Pulses: 2+ and symmetric Skin: Skin color, texture, turgor normal. No rashes or lesions Lymph nodes: Cervical, supraclavicular, and axillary nodes normal. Neurologic: Alert and oriented X 3, normal strength and tone. Normal symmetric reflexes. Normal coordination  and gait    Assessment:    Healthy female exam.      Plan:     Anticipatory guidance given including wearing seatbelts, smoke detectors in the home, increasing physical activity, increasing p.o. intake of water and vegetables. -We will obtain labs.  Pt wishes to return for labs next wk. -Mammogram done 09/14/2018 -pap advised -next CPE in 1 yr See After Visit Summary for Counseling Recommendations    Essential hypertension -improving -continue lifestyle modifications -continue Norvasc 2.5 mg, and lisinopril 40 mg daily  - Plan: BASIC METABOLIC PANEL WITH GFR  Screening for cholesterol level  - Plan: Lipid panel  Asymptomatic postmenopausal state -We will obtain DEXA scan  Cigarette nicotine dependence without complication -smoking cessation encouraged -currently smoking 10 cigs per day -consider cutting down -quit aids offered.  F/u in 3 months  Abbe Amsterdam, MD

## 2020-07-01 ENCOUNTER — Other Ambulatory Visit: Payer: Self-pay | Admitting: Family Medicine

## 2020-07-01 DIAGNOSIS — N200 Calculus of kidney: Secondary | ICD-10-CM

## 2020-07-03 NOTE — Telephone Encounter (Signed)
P want to know if she should still be taking Tamsulosin

## 2020-07-20 ENCOUNTER — Telehealth: Payer: Self-pay | Admitting: Family Medicine

## 2020-07-20 NOTE — Telephone Encounter (Signed)
Pt is calling in to see if she can get a call back about a personal matter.  Pt declined to speak to the triage nurse.

## 2020-07-21 NOTE — Telephone Encounter (Signed)
Pt is scheduled for a MyChart visit on 07/22/2020 at 3 pm for cough and congestion

## 2020-07-22 ENCOUNTER — Other Ambulatory Visit: Payer: Self-pay

## 2020-07-22 ENCOUNTER — Telehealth (INDEPENDENT_AMBULATORY_CARE_PROVIDER_SITE_OTHER): Payer: BC Managed Care – PPO | Admitting: Family Medicine

## 2020-07-22 ENCOUNTER — Encounter: Payer: Self-pay | Admitting: Family Medicine

## 2020-07-22 DIAGNOSIS — H04203 Unspecified epiphora, bilateral lacrimal glands: Secondary | ICD-10-CM | POA: Diagnosis not present

## 2020-07-22 DIAGNOSIS — J019 Acute sinusitis, unspecified: Secondary | ICD-10-CM

## 2020-07-22 MED ORDER — AMOXICILLIN 500 MG PO TABS: 500.0000 mg | ORAL_TABLET | Freq: Two times a day (BID) | ORAL | 0 refills | Status: AC

## 2020-07-22 NOTE — Progress Notes (Signed)
Virtual Visit via Telephone Note  I connected with [place] C Colcord on 07/22/20 at  3:00 PM EST by telephone and verified that I am speaking with the correct person using two identifiers.   I discussed the limitations, risks, security and privacy concerns of performing an evaluation and management service by telephone and the availability of in person appointments. I also discussed with the patient that there may be a patient responsible charge related to this service. The patient expressed understanding and agreed to proceed.  Location patient: home Location provider: work or home office Participants present for the call: patient, provider Patient did not have a visit in the prior 7 days to address this/these issue(s).   History of Present Illness: Pt is a 74 yo female with pmh sig for HTN, GERD, renal calculi, tobacco use who was seen for acute concern.  Pt endorses rhinorrhea, runny eyes, HAs at night, and congestion x 9 days.  Pt denies fever, cough, sore throat, sick contacts.  Patient has not tried anything for symptoms.  No longer taking Zyrtec.   Observations/Objective: Patient sounds cheerful and well on the phone. I do not appreciate any SOB. Speech and thought processing are grossly intact. Patient reported vitals: 130/80  Assessment and Plan: Subacute sinusitis, unspecified location  -Discussed supportive care -Okay to continue Flonase as needed - Plan: amoxicillin (AMOXIL) 500 MG tablet  Watery eyes -Continue regular eyedrops including latanoprost and timolol   Follow Up Instructions:   I did not refer this patient for an OV in the next 24 hours for this/these issue(s).  I discussed the assessment and treatment plan with the patient. The patient was provided an opportunity to ask questions and all were answered. The patient agreed with the plan and demonstrated an understanding of the instructions.   The patient was advised to call back or seek an in-person  evaluation if the symptoms worsen or if the condition fails to improve as anticipated.  I provided 11 minutes of non-face-to-face time during this encounter.   Deeann Saint, MD

## 2020-08-17 DIAGNOSIS — Z20828 Contact with and (suspected) exposure to other viral communicable diseases: Secondary | ICD-10-CM | POA: Diagnosis not present

## 2020-08-18 DIAGNOSIS — Z20828 Contact with and (suspected) exposure to other viral communicable diseases: Secondary | ICD-10-CM | POA: Diagnosis not present

## 2020-09-17 ENCOUNTER — Telehealth (INDEPENDENT_AMBULATORY_CARE_PROVIDER_SITE_OTHER): Payer: BC Managed Care – PPO | Admitting: Family Medicine

## 2020-09-17 ENCOUNTER — Encounter: Payer: Self-pay | Admitting: Family Medicine

## 2020-09-17 DIAGNOSIS — I10 Essential (primary) hypertension: Secondary | ICD-10-CM

## 2020-09-17 DIAGNOSIS — J019 Acute sinusitis, unspecified: Secondary | ICD-10-CM | POA: Diagnosis not present

## 2020-09-17 MED ORDER — AMOXICILLIN 500 MG PO TABS: 500.0000 mg | ORAL_TABLET | Freq: Two times a day (BID) | ORAL | 0 refills | Status: AC

## 2020-09-17 NOTE — Progress Notes (Signed)
Virtual Visit via Telephone Note  I connected with Erika Tucker on 09/17/20 at 11:30 AM EST by telephone and verified that I am speaking with the correct person using two identifiers.   I discussed the limitations, risks, security and privacy concerns of performing an evaluation and management service by telephone and the availability of in person appointments. I also discussed with the patient that there may be a patient responsible charge related to this service. The patient expressed understanding and agreed to proceed.  Location patient: home Location provider: work or home office Participants present for the call: patient, provider Patient did not have a visit in the prior 7 days to address this/these issue(s).   History of Present Illness: Pt is a 75 yo states she has a sinus infection.  Pt states her head hurts when she leans forward x 1 wk.  Pain is in the "top and the back" of her head.  Having pain in neck that moves up to back of head.  Endorses increased stress when at work.  Smoking 10 cigs per day.  Pt's bp 144, 126/79, 123/79, 135/78, 126/75, 142/85, 138/80, 125/76.  Tylenol helps the headache  Observations/Objective: Patient sounds cheerful and well on the phone. I do not appreciate any SOB. Speech and thought processing are grossly intact. Patient reported vitals:   Assessment and Plan: Essential hypertension -elevated.  Likely contributing to HAs in addition to increased stress. -Increase norvasc to 5 mg (take 2 of the 2.5 mg) -monitor bp at home and keep a log to bring you to clinic -increase po intake of water -given precautions  Subacute sinusitis, unspecified location   -given wait and see rx for amoxicillin.  If still having sx over the wknd can start the rx. - Plan: amoxicillin (AMOXIL) 500 MG tablet   Follow Up Instructions: F/u in 1 wk  99441 5-10 99442 11-20 9443 21-30 I did not refer this patient for an OV in the next 24 hours for this/these  issue(s).  I discussed the assessment and treatment plan with the patient. The patient was provided an opportunity to ask questions and all were answered. The patient agreed with the plan and demonstrated an understanding of the instructions.   The patient was advised to call back or seek an in-person evaluation if the symptoms worsen or if the condition fails to improve as anticipated.  I provided 15 minutes of non-face-to-face time during this encounter.   Deeann Saint, MD

## 2020-09-25 NOTE — Addendum Note (Signed)
Addended by: Lerry Liner on: 09/25/2020 02:19 PM   Modules accepted: Orders

## 2020-09-28 ENCOUNTER — Other Ambulatory Visit: Payer: Self-pay

## 2020-09-28 ENCOUNTER — Other Ambulatory Visit: Payer: Self-pay | Admitting: Family Medicine

## 2020-09-28 ENCOUNTER — Telehealth: Payer: Self-pay | Admitting: Family Medicine

## 2020-09-28 ENCOUNTER — Other Ambulatory Visit (INDEPENDENT_AMBULATORY_CARE_PROVIDER_SITE_OTHER): Payer: BC Managed Care – PPO

## 2020-09-28 DIAGNOSIS — I10 Essential (primary) hypertension: Secondary | ICD-10-CM

## 2020-09-28 DIAGNOSIS — Z13220 Encounter for screening for lipoid disorders: Secondary | ICD-10-CM | POA: Diagnosis not present

## 2020-09-28 DIAGNOSIS — N200 Calculus of kidney: Secondary | ICD-10-CM

## 2020-09-28 DIAGNOSIS — Z0000 Encounter for general adult medical examination without abnormal findings: Secondary | ICD-10-CM | POA: Diagnosis not present

## 2020-09-28 DIAGNOSIS — K219 Gastro-esophageal reflux disease without esophagitis: Secondary | ICD-10-CM

## 2020-09-28 LAB — LIPID PANEL
Cholesterol: 155 mg/dL (ref 0–200)
HDL: 51.5 mg/dL (ref >39.00)
LDL Cholesterol: 87 mg/dL (ref 0–99)
NonHDL: 103.05
Total CHOL/HDL Ratio: 3
Triglycerides: 80 mg/dL (ref 0.0–149.0)
VLDL: 16 mg/dL (ref 0.0–40.0)

## 2020-09-28 LAB — CBC WITH DIFFERENTIAL/PLATELET
Basophils Absolute: 0 10*3/uL (ref 0.0–0.1)
Basophils Relative: 0.6 % (ref 0.0–3.0)
Eosinophils Absolute: 0.1 10*3/uL (ref 0.0–0.7)
Eosinophils Relative: 1.6 % (ref 0.0–5.0)
HCT: 36.9 % (ref 36.0–46.0)
Hemoglobin: 12.4 g/dL (ref 12.0–15.0)
Lymphocytes Relative: 49.1 % — ABNORMAL HIGH (ref 12.0–46.0)
Lymphs Abs: 2.4 10*3/uL (ref 0.7–4.0)
MCHC: 33.7 g/dL (ref 30.0–36.0)
MCV: 98.7 fl (ref 78.0–100.0)
Monocytes Absolute: 0.3 10*3/uL (ref 0.1–1.0)
Monocytes Relative: 6.2 % (ref 3.0–12.0)
Neutro Abs: 2 10*3/uL (ref 1.4–7.7)
Neutrophils Relative %: 42.5 % — ABNORMAL LOW (ref 43.0–77.0)
Platelets: 253 10*3/uL (ref 150.0–400.0)
RBC: 3.74 Mil/uL — ABNORMAL LOW (ref 3.87–5.11)
RDW: 13.8 % (ref 11.5–15.5)
WBC: 4.8 10*3/uL (ref 4.0–10.5)

## 2020-09-28 LAB — BASIC METABOLIC PANEL
BUN: 13 mg/dL (ref 6–23)
CO2: 26 mEq/L (ref 19–32)
Calcium: 9.4 mg/dL (ref 8.4–10.5)
Chloride: 107 mEq/L (ref 96–112)
Creatinine, Ser: 1.1 mg/dL (ref 0.40–1.20)
GFR: 50.08 mL/min — ABNORMAL LOW (ref >60.00)
Glucose, Bld: 95 mg/dL (ref 70–99)
Potassium: 3.9 mEq/L (ref 3.5–5.1)
Sodium: 141 mEq/L (ref 135–145)

## 2020-09-28 MED ORDER — LISINOPRIL 40 MG PO TABS: 40.0000 mg | ORAL_TABLET | Freq: Every day | ORAL | 3 refills | Status: DC

## 2020-09-28 MED ORDER — AMLODIPINE BESYLATE 5 MG PO TABS: ORAL_TABLET | ORAL | 1 refills | Status: DC

## 2020-09-28 MED ORDER — OMEPRAZOLE 40 MG PO CPDR: DELAYED_RELEASE_CAPSULE | ORAL | 1 refills | Status: DC

## 2020-09-28 NOTE — Telephone Encounter (Signed)
New Rx for Amlodipine sent to pt pharmacy, Rx increased to 5 mg from 2.5 mg by Dr Salomon Fick on pt last visit

## 2020-09-28 NOTE — Telephone Encounter (Signed)
Patient states that Dr. Salomon Fick went up on her amlodipine and she needs a refill.  Please advise.

## 2020-10-01 NOTE — Telephone Encounter (Signed)
Please advise if ok to send alternative

## 2020-10-05 ENCOUNTER — Other Ambulatory Visit: Payer: Self-pay

## 2020-10-05 DIAGNOSIS — K219 Gastro-esophageal reflux disease without esophagitis: Secondary | ICD-10-CM

## 2020-10-05 MED ORDER — PANTOPRAZOLE SODIUM 40 MG PO TBEC: 40.0000 mg | DELAYED_RELEASE_TABLET | Freq: Every day | ORAL | 1 refills | Status: DC

## 2020-10-05 NOTE — Telephone Encounter (Signed)
Alternative Rx for Protonix sent

## 2020-10-05 NOTE — Telephone Encounter (Signed)
Insurance will not py for this Rx since pt can get it OTC. Please advise if ok to send alternative

## 2020-10-05 NOTE — Telephone Encounter (Signed)
Can try protonix 20 mg daily, but insurance may not pay for this as it is also available over-the-counter.

## 2020-10-19 ENCOUNTER — Other Ambulatory Visit: Payer: Self-pay | Admitting: Family Medicine

## 2020-10-19 DIAGNOSIS — N200 Calculus of kidney: Secondary | ICD-10-CM

## 2020-10-19 DIAGNOSIS — J302 Other seasonal allergic rhinitis: Secondary | ICD-10-CM

## 2020-10-19 DIAGNOSIS — I10 Essential (primary) hypertension: Secondary | ICD-10-CM

## 2020-10-20 ENCOUNTER — Other Ambulatory Visit: Payer: Self-pay | Admitting: Family Medicine

## 2020-10-20 DIAGNOSIS — I10 Essential (primary) hypertension: Secondary | ICD-10-CM

## 2020-11-12 ENCOUNTER — Other Ambulatory Visit: Payer: Self-pay | Admitting: Family Medicine

## 2020-11-12 DIAGNOSIS — I10 Essential (primary) hypertension: Secondary | ICD-10-CM

## 2020-11-20 ENCOUNTER — Telehealth: Payer: Self-pay | Admitting: Family Medicine

## 2020-11-20 NOTE — Telephone Encounter (Signed)
Refill was sent to pharmacy on 11/12/20, called pharmacy and they confirmed they received it. Refill is ready for p/u at pharmacy.

## 2020-11-20 NOTE — Telephone Encounter (Signed)
Patient is needing a refill on her amLODipine (NORVASC) 5 MG tablet  Pharmacy: [facility].  Please advise.

## 2021-02-22 ENCOUNTER — Telehealth: Payer: Self-pay | Admitting: Family Medicine

## 2021-02-22 NOTE — Telephone Encounter (Signed)
Spoke with patient, Dr Salomon Fick advised an OTC allergy pill. Pt asked to have certirizine  refilled. Rx refills are at pharmacy from 10/2020

## 2021-02-22 NOTE — Telephone Encounter (Signed)
Pt call and want to know if dr.Banks will call her something in for hives and want a call back.

## 2021-02-23 DIAGNOSIS — H401131 Primary open-angle glaucoma, bilateral, mild stage: Secondary | ICD-10-CM | POA: Diagnosis not present

## 2021-02-23 DIAGNOSIS — H04123 Dry eye syndrome of bilateral lacrimal glands: Secondary | ICD-10-CM | POA: Diagnosis not present

## 2021-02-23 DIAGNOSIS — H43811 Vitreous degeneration, right eye: Secondary | ICD-10-CM | POA: Diagnosis not present

## 2021-02-23 DIAGNOSIS — H25813 Combined forms of age-related cataract, bilateral: Secondary | ICD-10-CM | POA: Diagnosis not present

## 2021-03-16 ENCOUNTER — Other Ambulatory Visit: Payer: Self-pay | Admitting: Family Medicine

## 2021-03-16 DIAGNOSIS — K219 Gastro-esophageal reflux disease without esophagitis: Secondary | ICD-10-CM

## 2021-04-09 ENCOUNTER — Other Ambulatory Visit: Payer: BC Managed Care – PPO

## 2021-04-09 ENCOUNTER — Telehealth (INDEPENDENT_AMBULATORY_CARE_PROVIDER_SITE_OTHER): Payer: BC Managed Care – PPO | Admitting: Family Medicine

## 2021-04-09 ENCOUNTER — Other Ambulatory Visit: Payer: Self-pay | Admitting: Family Medicine

## 2021-04-09 ENCOUNTER — Encounter: Payer: Self-pay | Admitting: Family Medicine

## 2021-04-09 VITALS — BP 124/72 | Temp 98.6°F

## 2021-04-09 DIAGNOSIS — Z87442 Personal history of urinary calculi: Secondary | ICD-10-CM

## 2021-04-09 DIAGNOSIS — R300 Dysuria: Secondary | ICD-10-CM

## 2021-04-09 DIAGNOSIS — M545 Low back pain, unspecified: Secondary | ICD-10-CM

## 2021-04-09 MED ORDER — TAMSULOSIN HCL 0.4 MG PO CAPS: 0.4000 mg | ORAL_CAPSULE | Freq: Every day | ORAL | 0 refills | Status: DC

## 2021-04-09 MED ORDER — TRAMADOL HCL 50 MG PO TABS: 50.0000 mg | ORAL_TABLET | Freq: Three times a day (TID) | ORAL | 0 refills | Status: AC | PRN

## 2021-04-09 NOTE — Progress Notes (Signed)
Virtual Visit via Telephone Note  I connected with Erika Tucker on 04/09/21 at  8:00 AM EDT by telephone and verified that I am speaking with the correct person using two identifiers.   I discussed the limitations, risks, security and privacy concerns of performing an evaluation and management service by telephone and the availability of in person appointments. I also discussed with the patient that there may be a patient responsible charge related to this service. The patient expressed understanding and agreed to proceed.  Location patient: home Location provider: work or home office Participants present for the call: patient, provider Patient did not have a visit in the prior 7 days to address this/these issue(s).   History of Present Illness: Pt with R low back and groin pain x 5 days.  Pt with history of renal calculi. Endorses dysuria, n, constipation-last BM Wed. Denies vomiting, fever.   Observations/Objective: Patient sounds cheerful and well on the phone. I do not appreciate any SOB. Speech and thought processing are grossly intact. Patient reported vitals:  Assessment and Plan: Dysuria - Plan: POCT urinalysis dipstick, Culture, Urine  Acute right-sided low back pain without sciatica  History of renal calculi  Renal calculi suspected, but need to r/o UTI or pyelonephritis.  Pt to try to give urine sample at [facility] office today.  Increase po intake of fluids.  Monitor for stone in urine.  Will send in prescriptions as needed based on results.  If unable to provide sample, for worsening symptoms proceed to nearest UC or or ED.  Follow Up Instructions:  F/u prn   99441 5-10 99442 11-20 9443 21-30 I did not refer this patient for an OV in the next 24 hours for this/these issue(s).  I discussed the assessment and treatment plan with the patient. The patient was provided an opportunity to ask questions and all were answered. The patient agreed with the plan and  demonstrated an understanding of the instructions.   The patient was advised to call back or seek an in-person evaluation if the symptoms worsen or if the condition fails to improve as anticipated.  I provided 9 minutes of non-face-to-face time during this encounter.   Deeann Saint, MD

## 2021-04-09 NOTE — Progress Notes (Signed)
Patient called in regards to prescriptions for Flomax and tramadol that was sent to her pharmacy.  Urine culture pending.  Unclear if UA obtained as wife had difficulty seeing orders in system.  Given precautions.  PDMP reviewed.  Abbe Amsterdam, MD

## 2021-04-11 ENCOUNTER — Other Ambulatory Visit: Payer: Self-pay | Admitting: Family Medicine

## 2021-04-11 DIAGNOSIS — K219 Gastro-esophageal reflux disease without esophagitis: Secondary | ICD-10-CM

## 2021-04-11 LAB — URINE CULTURE
MICRO NUMBER:: 12296900
SPECIMEN QUALITY:: ADEQUATE

## 2021-04-12 ENCOUNTER — Other Ambulatory Visit: Payer: Self-pay | Admitting: Family Medicine

## 2021-04-12 DIAGNOSIS — N390 Urinary tract infection, site not specified: Secondary | ICD-10-CM

## 2021-04-12 MED ORDER — AMOXICILLIN-POT CLAVULANATE 500-125 MG PO TABS: 1.0000 | ORAL_TABLET | Freq: Two times a day (BID) | ORAL | 0 refills | Status: AC

## 2021-04-16 ENCOUNTER — Telehealth: Payer: Self-pay | Admitting: Family Medicine

## 2021-04-16 NOTE — Telephone Encounter (Signed)
Spoke with pt, per PCP, pt is going after she eats but does not have smell, informed pt she can take imodium AD. Pt states she will take it.

## 2021-04-16 NOTE — Telephone Encounter (Signed)
Patient stated that amoxicillin-clavulanate (AUGMENTIN) 500-125 MG tablet [[id number]] is causing her to have diarrhea. She wants to know if there is something that she could take over the counter or something that can be prescribed to her that would help.  Patient can be contacted at [phone].  [id number], [place] - [address]  [address], [place] [address]   Please advise.

## 2021-04-17 DIAGNOSIS — R42 Dizziness and giddiness: Secondary | ICD-10-CM | POA: Diagnosis not present

## 2021-04-17 DIAGNOSIS — F17210 Nicotine dependence, cigarettes, uncomplicated: Secondary | ICD-10-CM | POA: Diagnosis not present

## 2021-04-17 DIAGNOSIS — I10 Essential (primary) hypertension: Secondary | ICD-10-CM | POA: Diagnosis not present

## 2021-04-17 DIAGNOSIS — Z886 Allergy status to analgesic agent status: Secondary | ICD-10-CM | POA: Diagnosis not present

## 2021-04-17 DIAGNOSIS — R5383 Other fatigue: Secondary | ICD-10-CM | POA: Diagnosis not present

## 2021-04-17 DIAGNOSIS — R55 Syncope and collapse: Secondary | ICD-10-CM | POA: Diagnosis not present

## 2021-04-17 DIAGNOSIS — R300 Dysuria: Secondary | ICD-10-CM | POA: Diagnosis not present

## 2021-04-17 DIAGNOSIS — R079 Chest pain, unspecified: Secondary | ICD-10-CM | POA: Diagnosis not present

## 2021-04-17 DIAGNOSIS — E860 Dehydration: Secondary | ICD-10-CM | POA: Diagnosis not present

## 2021-04-26 ENCOUNTER — Ambulatory Visit: Payer: BC Managed Care – PPO | Admitting: Family Medicine

## 2021-04-26 ENCOUNTER — Encounter: Payer: Self-pay | Admitting: Family Medicine

## 2021-04-26 ENCOUNTER — Other Ambulatory Visit: Payer: Self-pay

## 2021-04-26 VITALS — BP 138/68 | HR 94 | Temp 98.5°F | Wt 103.8 lb

## 2021-04-26 DIAGNOSIS — B379 Candidiasis, unspecified: Secondary | ICD-10-CM

## 2021-04-26 DIAGNOSIS — D539 Nutritional anemia, unspecified: Secondary | ICD-10-CM | POA: Diagnosis not present

## 2021-04-26 DIAGNOSIS — R7401 Elevation of levels of liver transaminase levels: Secondary | ICD-10-CM

## 2021-04-26 DIAGNOSIS — F17210 Nicotine dependence, cigarettes, uncomplicated: Secondary | ICD-10-CM | POA: Diagnosis not present

## 2021-04-26 DIAGNOSIS — T3695XA Adverse effect of unspecified systemic antibiotic, initial encounter: Secondary | ICD-10-CM

## 2021-04-26 MED ORDER — NICOTINE 14 MG/24HR TD PT24: 14.0000 mg | MEDICATED_PATCH | Freq: Every day | TRANSDERMAL | 0 refills | Status: DC

## 2021-04-26 MED ORDER — FLUCONAZOLE 150 MG PO TABS: 150.0000 mg | ORAL_TABLET | Freq: Once | ORAL | 0 refills | Status: AC

## 2021-04-26 NOTE — Progress Notes (Signed)
Subjective:    Patient ID: Erika Tucker, female    DOB: Aug 29, 1945, 75 y.o.   MRN: [id number]  Chief Complaint  Patient presents with   Abdominal Pain    Wants to discuss gallstones    HPI Patient was seen today for follow-up.  Patient seen in follow-up virtually 04/09/2021 for dysuria.  Concern for renal calculi given history.  Urine culture positive for UTI.  Patient endorses missing a few days of antibiotic.  Patient was seen in ED in [place] for near syncopal episode and dehydration.  Advised to complete antibiotics for bladder infection.  Endorses feeling better after IVF.  Labs from ED visit reviewed.  Since ED visit, trying to increase po intake of water and fluids.  Cut down on cigarettes.  Now smoking 1 pk q 2 days.  Has questions about nicoderm patches.  Notes yeast infection after completing ABX course.  Pt notes bp at home typically in the 1teens/70s-80s.  Has a bp log with her.  Past Medical History:  Diagnosis Date   GERD (gastroesophageal reflux disease)    Nephrolithiasis    Wrist tendonitis     Allergies  Allergen Reactions   Aspirin     REACTION: GI Intolerance    ROS General: Denies fever, chills, night sweats, changes in weight, changes in appetite HEENT: Denies headaches, ear pain, changes in vision, rhinorrhea, sore throat CV: Denies CP, palpitations, SOB, orthopnea Pulm: Denies SOB, cough, wheezing GI: Denies abdominal pain, nausea, vomiting, diarrhea, constipation GU: Denies dysuria, hematuria, frequency  + vaginal irritation/discharge   Msk: Denies muscle cramps, joint pains Neuro: Denies weakness, numbness, tingling Skin: Denies rashes, bruising Psych: Denies depression, anxiety, hallucinations    Objective:    Blood pressure 138/68, pulse 94, temperature 98.5 F (36.9 C), temperature source Oral, weight 103 lb 12.8 oz (47.1 kg), SpO2 97 %.  Gen. Pleasant, well-nourished, in no distress, normal affect   HEENT: [place]/AT, face symmetric, conjunctiva  clear, no scleral icterus, PERRLA, EOMI, nares patent without drainage Lungs: no accessory muscle use Cardiovascular: RRR, no peripheral edema Musculoskeletal: No deformities, no cyanosis or clubbing, normal tone Neuro:  A&Ox3, CN II-XII intact, normal gait Skin:  Warm, no lesions/ rash   Wt Readings from Last 3 Encounters:  05/29/20 105 lb (47.6 kg)  03/16/20 105 lb (47.6 kg)  11/21/19 102 lb (46.3 kg)    Lab Results  Component Value Date   WBC 4.8 09/28/2020   HGB 12.4 09/28/2020   HCT 36.9 09/28/2020   PLT 253.0 09/28/2020   GLUCOSE 95 09/28/2020   CHOL 155 09/28/2020   TRIG 80.0 09/28/2020   HDL 51.50 09/28/2020   LDLCALC 87 09/28/2020   ALT 9 10/21/2015   AST 16 10/21/2015   NA 141 09/28/2020   K 3.9 09/28/2020   CL 107 09/28/2020   CREATININE 1.10 09/28/2020   BUN 13 09/28/2020   CO2 26 09/28/2020   TSH 0.85 12/11/2017    Assessment/Plan:  Antibiotic-induced yeast infection  - Plan: fluconazole (DIFLUCAN) 150 MG tablet  Cigarette nicotine dependence without complication  -Smoking cessation counseling greater than 3 minutes, less than 5 minutes -Patient smoking 1 pack every 2 days.  Interested in quitting -Discussed quit options.  Will try NicoDerm 14 mg/24hr patches.  Contact clinic in 6 weeks for Rx for lower dose of NicoDerm patches -Reassess at each visit - Plan: nicotine (NICODERM CQ) 14 mg/24hr patch  Hypocalcemia  -Per chart review calcium 8.5 on 04/17/2021 - Plan: CMP, Parathyroid hormone, intact (  no Ca)  Elevated AST (SGOT)  -AST 86 on 04/17/2021 - Plan: CMP  Macrocytic anemia  -Hemoglobin 10.4 and MCV 109/3/22 - Plan: CBC with Differential/Platelet, Vitamin B12, Folate  F/u in 1 month if needed  Abbe Amsterdam, MD

## 2021-04-26 NOTE — Patient Instructions (Signed)
A prescription for NicoDerm patches was sent to your pharmacy.  Apply 1 patch daily for 6 weeks.  When you are almost out of patches let the clinic know so we can send in a prescription for a lower dose of the patches.

## 2021-04-27 LAB — CBC WITH DIFFERENTIAL/PLATELET
Basophils Absolute: 0 10*3/uL (ref 0.0–0.1)
Basophils Relative: 0.3 % (ref 0.0–3.0)
Eosinophils Absolute: 0.2 10*3/uL (ref 0.0–0.7)
Eosinophils Relative: 3.7 % (ref 0.0–5.0)
HCT: 32.7 % — ABNORMAL LOW (ref 36.0–46.0)
Hemoglobin: 11 g/dL — ABNORMAL LOW (ref 12.0–15.0)
Lymphocytes Relative: 52.2 % — ABNORMAL HIGH (ref 12.0–46.0)
Lymphs Abs: 3.1 10*3/uL (ref 0.7–4.0)
MCHC: 33.6 g/dL (ref 30.0–36.0)
MCV: 100.4 fl — ABNORMAL HIGH (ref 78.0–100.0)
Monocytes Absolute: 0.5 10*3/uL (ref 0.1–1.0)
Monocytes Relative: 8.1 % (ref 3.0–12.0)
Neutro Abs: 2.1 10*3/uL (ref 1.4–7.7)
Neutrophils Relative %: 35.7 % — ABNORMAL LOW (ref 43.0–77.0)
Platelets: 233 10*3/uL (ref 150.0–400.0)
RBC: 3.26 Mil/uL — ABNORMAL LOW (ref 3.87–5.11)
RDW: 14 % (ref 11.5–15.5)
WBC: 5.9 10*3/uL (ref 4.0–10.5)

## 2021-04-27 LAB — COMPREHENSIVE METABOLIC PANEL
ALT: 35 U/L (ref 0–35)
AST: 38 U/L — ABNORMAL HIGH (ref 0–37)
Albumin: 4.1 g/dL (ref 3.5–5.2)
Alkaline Phosphatase: 98 U/L (ref 39–117)
BUN: 14 mg/dL (ref 6–23)
CO2: 26 mEq/L (ref 19–32)
Calcium: 9 mg/dL (ref 8.4–10.5)
Chloride: 105 mEq/L (ref 96–112)
Creatinine, Ser: 1.11 mg/dL (ref 0.40–1.20)
GFR: 49.34 mL/min — ABNORMAL LOW (ref >60.00)
Glucose, Bld: 79 mg/dL (ref 70–99)
Potassium: 4 mEq/L (ref 3.5–5.1)
Sodium: 139 mEq/L (ref 135–145)
Total Bilirubin: 0.3 mg/dL (ref 0.2–1.2)
Total Protein: 6.9 g/dL (ref 6.0–8.3)

## 2021-04-27 LAB — VITAMIN B12: Vitamin B-12: 155 pg/mL — ABNORMAL LOW (ref 211–911)

## 2021-04-27 LAB — FOLATE: Folate: 10.1 ng/mL (ref >5.9)

## 2021-04-27 LAB — PARATHYROID HORMONE, INTACT (NO CA): PTH: 55 pg/mL (ref 16–77)

## 2021-05-02 ENCOUNTER — Other Ambulatory Visit: Payer: Self-pay | Admitting: Family Medicine

## 2021-05-02 DIAGNOSIS — Z87442 Personal history of urinary calculi: Secondary | ICD-10-CM

## 2021-05-03 DIAGNOSIS — Z01419 Encounter for gynecological examination (general) (routine) without abnormal findings: Secondary | ICD-10-CM | POA: Diagnosis not present

## 2021-05-07 ENCOUNTER — Ambulatory Visit (INDEPENDENT_AMBULATORY_CARE_PROVIDER_SITE_OTHER): Payer: BC Managed Care – PPO

## 2021-05-07 ENCOUNTER — Other Ambulatory Visit: Payer: Self-pay

## 2021-05-07 DIAGNOSIS — E538 Deficiency of other specified B group vitamins: Secondary | ICD-10-CM | POA: Diagnosis not present

## 2021-05-07 MED ORDER — CYANOCOBALAMIN 1000 MCG/ML IJ SOLN
1000.0000 ug | Freq: Once | INTRAMUSCULAR | Status: AC
  Administered 2021-05-07: 1000 ug via INTRAMUSCULAR

## 2021-05-07 NOTE — Progress Notes (Signed)
Per orders of Dr. Banks, injection of B12 given by Kaydan Wong. Patient tolerated injection well.  

## 2021-05-13 ENCOUNTER — Other Ambulatory Visit: Payer: Self-pay | Admitting: Family Medicine

## 2021-05-13 DIAGNOSIS — I10 Essential (primary) hypertension: Secondary | ICD-10-CM

## 2021-05-21 ENCOUNTER — Other Ambulatory Visit: Payer: Self-pay | Admitting: Family Medicine

## 2021-05-21 DIAGNOSIS — K219 Gastro-esophageal reflux disease without esophagitis: Secondary | ICD-10-CM

## 2021-05-21 DIAGNOSIS — Z87442 Personal history of urinary calculi: Secondary | ICD-10-CM

## 2021-05-21 DIAGNOSIS — J302 Other seasonal allergic rhinitis: Secondary | ICD-10-CM

## 2021-06-04 ENCOUNTER — Other Ambulatory Visit: Payer: Self-pay

## 2021-06-04 ENCOUNTER — Ambulatory Visit (INDEPENDENT_AMBULATORY_CARE_PROVIDER_SITE_OTHER): Payer: BC Managed Care – PPO | Admitting: *Deleted

## 2021-06-04 DIAGNOSIS — E538 Deficiency of other specified B group vitamins: Secondary | ICD-10-CM

## 2021-06-04 MED ORDER — CYANOCOBALAMIN 1000 MCG/ML IJ SOLN
1000.0000 ug | Freq: Once | INTRAMUSCULAR | Status: AC
Start: 2021-06-04 — End: 2021-06-04
  Administered 2021-06-04: 1000 ug via INTRAMUSCULAR

## 2021-06-04 NOTE — Progress Notes (Signed)
Per orders of Dr. Banks, injection of Cyanocobalamin 1000mcg given by Antiono Ettinger A. °Patient tolerated injection well. ° °

## 2021-06-07 ENCOUNTER — Ambulatory Visit: Payer: BC Managed Care – PPO

## 2021-07-05 ENCOUNTER — Ambulatory Visit: Payer: BC Managed Care – PPO

## 2021-07-06 ENCOUNTER — Ambulatory Visit (INDEPENDENT_AMBULATORY_CARE_PROVIDER_SITE_OTHER): Payer: BC Managed Care – PPO

## 2021-07-06 DIAGNOSIS — E538 Deficiency of other specified B group vitamins: Secondary | ICD-10-CM | POA: Diagnosis not present

## 2021-07-06 MED ORDER — CYANOCOBALAMIN 1000 MCG/ML IJ SOLN
1000.0000 ug | Freq: Once | INTRAMUSCULAR | Status: AC
  Administered 2021-07-06: 1000 ug via INTRAMUSCULAR

## 2021-07-06 NOTE — Progress Notes (Signed)
Per orders of Dr. Banks, injection of Cyanocobalamin 1,000 mcg/mL given by Cheikh Bramble N Gaines Cartmell. Patient tolerated injection well.  

## 2021-08-27 DIAGNOSIS — H401131 Primary open-angle glaucoma, bilateral, mild stage: Secondary | ICD-10-CM | POA: Diagnosis not present

## 2021-08-27 DIAGNOSIS — H0102A Squamous blepharitis right eye, upper and lower eyelids: Secondary | ICD-10-CM | POA: Diagnosis not present

## 2021-08-27 DIAGNOSIS — H25813 Combined forms of age-related cataract, bilateral: Secondary | ICD-10-CM | POA: Diagnosis not present

## 2021-08-27 DIAGNOSIS — H04123 Dry eye syndrome of bilateral lacrimal glands: Secondary | ICD-10-CM | POA: Diagnosis not present

## 2021-10-12 DIAGNOSIS — H401131 Primary open-angle glaucoma, bilateral, mild stage: Secondary | ICD-10-CM | POA: Diagnosis not present

## 2021-10-12 DIAGNOSIS — H25813 Combined forms of age-related cataract, bilateral: Secondary | ICD-10-CM | POA: Diagnosis not present

## 2021-10-12 DIAGNOSIS — H43811 Vitreous degeneration, right eye: Secondary | ICD-10-CM | POA: Diagnosis not present

## 2021-10-22 ENCOUNTER — Other Ambulatory Visit: Payer: Self-pay | Admitting: Family Medicine

## 2021-10-22 DIAGNOSIS — F17210 Nicotine dependence, cigarettes, uncomplicated: Secondary | ICD-10-CM

## 2021-10-22 DIAGNOSIS — Z87442 Personal history of urinary calculi: Secondary | ICD-10-CM

## 2021-10-22 DIAGNOSIS — K219 Gastro-esophageal reflux disease without esophagitis: Secondary | ICD-10-CM

## 2021-10-22 DIAGNOSIS — I10 Essential (primary) hypertension: Secondary | ICD-10-CM

## 2021-12-28 ENCOUNTER — Ambulatory Visit: Payer: BC Managed Care – PPO | Admitting: Family Medicine

## 2021-12-28 ENCOUNTER — Encounter: Payer: Self-pay | Admitting: Family Medicine

## 2021-12-28 VITALS — BP 118/70 | HR 74 | Temp 98.3°F | Wt 101.1 lb

## 2021-12-28 DIAGNOSIS — L309 Dermatitis, unspecified: Secondary | ICD-10-CM

## 2021-12-28 DIAGNOSIS — J3089 Other allergic rhinitis: Secondary | ICD-10-CM | POA: Diagnosis not present

## 2021-12-28 MED ORDER — TRIAMCINOLONE ACETONIDE 0.5 % EX OINT: 1.0000 "application " | TOPICAL_OINTMENT | Freq: Two times a day (BID) | CUTANEOUS | 0 refills | Status: DC

## 2021-12-28 NOTE — Progress Notes (Signed)
? ?  Subjective:  ? ? Patient ID: Erika Tucker, female    DOB: 11/07/1945, 76 y.o.   MRN: [id number] ? ?HPI ?Here for one week of an itchy rash on both upper eyelids. She has also been sneezing a lot this week. No fever or ST or cough. She takes Zyrtec daily, and she used to take Flonase. However she stopped the Flonase a month ago. Lately she has been applying Vaseline to her eyelids.  ? ? ?Review of Systems  ?Constitutional: Negative.   ?HENT:  Positive for rhinorrhea and sneezing. Negative for congestion, facial swelling, postnasal drip and sore throat.   ?Respiratory: Negative.    ? ?   ?Objective:  ? Physical Exam ?Constitutional:   ?   Appearance: Normal appearance.  ?HENT:  ?   Right Ear: Tympanic membrane, ear canal and external ear normal.  ?   Left Ear: Tympanic membrane, ear canal and external ear normal.  ?   Nose: Nose normal.  ?   Mouth/Throat:  ?   Pharynx: Oropharynx is clear.  ?Eyes:  ?   Conjunctiva/sclera: Conjunctivae normal.  ?   Comments: Both upper eyelids are pink and scaly   ?Pulmonary:  ?   Effort: Pulmonary effort is normal.  ?   Breath sounds: Normal breath sounds.  ?Lymphadenopathy:  ?   Cervical: No cervical adenopathy.  ?Neurological:  ?   Mental Status: She is alert.  ? ? ? ? ? ?   ?Assessment & Plan:  ?She has some dermatitis on the eyelids, and this is part of a general allergy state in her body. She sill continue to take Zyrtec, and I advisd her to get back on the daily Flonase. She will try applying Triamcinolone ointment to the eyelids BID as needed.  ?Gershon Crane, MD ? ? ?

## 2022-01-18 DIAGNOSIS — H1013 Acute atopic conjunctivitis, bilateral: Secondary | ICD-10-CM | POA: Diagnosis not present

## 2022-01-18 DIAGNOSIS — H401131 Primary open-angle glaucoma, bilateral, mild stage: Secondary | ICD-10-CM | POA: Diagnosis not present

## 2022-02-20 ENCOUNTER — Other Ambulatory Visit: Payer: Self-pay | Admitting: Family Medicine

## 2022-02-20 DIAGNOSIS — I10 Essential (primary) hypertension: Secondary | ICD-10-CM

## 2022-05-17 DIAGNOSIS — K219 Gastro-esophageal reflux disease without esophagitis: Secondary | ICD-10-CM | POA: Diagnosis not present

## 2022-05-17 DIAGNOSIS — I10 Essential (primary) hypertension: Secondary | ICD-10-CM | POA: Diagnosis not present

## 2022-05-17 DIAGNOSIS — E7800 Pure hypercholesterolemia, unspecified: Secondary | ICD-10-CM | POA: Diagnosis not present

## 2022-05-17 DIAGNOSIS — Z01419 Encounter for gynecological examination (general) (routine) without abnormal findings: Secondary | ICD-10-CM | POA: Diagnosis not present

## 2022-06-21 DIAGNOSIS — H04123 Dry eye syndrome of bilateral lacrimal glands: Secondary | ICD-10-CM | POA: Diagnosis not present

## 2022-06-21 DIAGNOSIS — H401131 Primary open-angle glaucoma, bilateral, mild stage: Secondary | ICD-10-CM | POA: Diagnosis not present

## 2022-07-11 ENCOUNTER — Encounter: Payer: Self-pay | Admitting: Family Medicine

## 2022-07-11 ENCOUNTER — Ambulatory Visit (INDEPENDENT_AMBULATORY_CARE_PROVIDER_SITE_OTHER): Payer: BC Managed Care – PPO | Admitting: Family Medicine

## 2022-07-11 VITALS — BP 124/74 | HR 81 | Temp 98.1°F | Ht 65.0 in | Wt 101.6 lb

## 2022-07-11 DIAGNOSIS — K219 Gastro-esophageal reflux disease without esophagitis: Secondary | ICD-10-CM

## 2022-07-11 DIAGNOSIS — Z0000 Encounter for general adult medical examination without abnormal findings: Secondary | ICD-10-CM

## 2022-07-11 DIAGNOSIS — L819 Disorder of pigmentation, unspecified: Secondary | ICD-10-CM | POA: Diagnosis not present

## 2022-07-11 DIAGNOSIS — L811 Chloasma: Secondary | ICD-10-CM

## 2022-07-11 DIAGNOSIS — Z1159 Encounter for screening for other viral diseases: Secondary | ICD-10-CM | POA: Diagnosis not present

## 2022-07-11 DIAGNOSIS — M778 Other enthesopathies, not elsewhere classified: Secondary | ICD-10-CM

## 2022-07-11 DIAGNOSIS — I10 Essential (primary) hypertension: Secondary | ICD-10-CM | POA: Diagnosis not present

## 2022-07-11 DIAGNOSIS — F17210 Nicotine dependence, cigarettes, uncomplicated: Secondary | ICD-10-CM | POA: Diagnosis not present

## 2022-07-11 DIAGNOSIS — Z780 Asymptomatic menopausal state: Secondary | ICD-10-CM

## 2022-07-11 LAB — LIPID PANEL
Cholesterol: 180 mg/dL (ref 0–200)
HDL: 51.3 mg/dL (ref >39.00)
LDL Cholesterol: 110 mg/dL — ABNORMAL HIGH (ref 0–99)
NonHDL: 128.68
Total CHOL/HDL Ratio: 4
Triglycerides: 92 mg/dL (ref 0.0–149.0)
VLDL: 18.4 mg/dL (ref 0.0–40.0)

## 2022-07-11 LAB — BASIC METABOLIC PANEL
BUN: 16 mg/dL (ref 6–23)
CO2: 27 mEq/L (ref 19–32)
Calcium: 9.4 mg/dL (ref 8.4–10.5)
Chloride: 105 mEq/L (ref 96–112)
Creatinine, Ser: 1.23 mg/dL — ABNORMAL HIGH (ref 0.40–1.20)
GFR: 43.25 mL/min — ABNORMAL LOW (ref >60.00)
Glucose, Bld: 99 mg/dL (ref 70–99)
Potassium: 4.5 mEq/L (ref 3.5–5.1)
Sodium: 139 mEq/L (ref 135–145)

## 2022-07-11 LAB — CBC WITH DIFFERENTIAL/PLATELET
Basophils Absolute: 0 10*3/uL (ref 0.0–0.1)
Basophils Relative: 0.4 % (ref 0.0–3.0)
Eosinophils Absolute: 0.1 10*3/uL (ref 0.0–0.7)
Eosinophils Relative: 2.7 % (ref 0.0–5.0)
HCT: 36.9 % (ref 36.0–46.0)
Hemoglobin: 12.4 g/dL (ref 12.0–15.0)
Lymphocytes Relative: 45.5 % (ref 12.0–46.0)
Lymphs Abs: 2.5 10*3/uL (ref 0.7–4.0)
MCHC: 33.7 g/dL (ref 30.0–36.0)
MCV: 98.9 fl (ref 78.0–100.0)
Monocytes Absolute: 0.4 10*3/uL (ref 0.1–1.0)
Monocytes Relative: 6.3 % (ref 3.0–12.0)
Neutro Abs: 2.5 10*3/uL (ref 1.4–7.7)
Neutrophils Relative %: 45.1 % (ref 43.0–77.0)
Platelets: 288 10*3/uL (ref 150.0–400.0)
RBC: 3.73 Mil/uL — ABNORMAL LOW (ref 3.87–5.11)
RDW: 13.7 % (ref 11.5–15.5)
WBC: 5.6 10*3/uL (ref 4.0–10.5)

## 2022-07-11 MED ORDER — LISINOPRIL 40 MG PO TABS: 40.0000 mg | ORAL_TABLET | Freq: Every day | ORAL | 3 refills | Status: DC

## 2022-07-11 MED ORDER — OMEPRAZOLE 40 MG PO CPDR: 40.0000 mg | DELAYED_RELEASE_CAPSULE | Freq: Every day | ORAL | 1 refills | Status: DC

## 2022-07-11 MED ORDER — AMLODIPINE BESYLATE 5 MG PO TABS: 5.0000 mg | ORAL_TABLET | Freq: Every day | ORAL | 1 refills | Status: DC

## 2022-07-11 NOTE — Progress Notes (Signed)
Subjective:     Erika Tucker is a 76 y.o. female and is here for a comprehensive physical exam. The patient reports doing well.  Patient states she is cut down on smoking.  No longer smoking car or in the house.  Smoking less at work as she now only gets 3 breaks.  Patient states something always happens when she tries to quit.  Pt with soreness in upper lateral flank and L elbow after raking leaves last week.  Patient also notes soreness in left shoulder after running into a wall.  Patient notices soreness with certain movements.  Patient inquires about how to remove dark discoloration on nose.  Patient seen by OB/GYN, Dr. Cherly Hensen.  States had Pap recently.  Typically gets mammograms there as well.  Had influenza vaccine this year.  Considering pneumonia vaccine.  Social History   Socioeconomic History   Marital status: Divorced    Spouse name: Not on file   Number of children: Not on file   Years of education: Not on file   Highest education level: Not on file  Occupational History   Not on file  Tobacco Use   Smoking status: Every Day    Packs/day: 0.25    Types: Cigarettes   Smokeless tobacco: Never  Substance and Sexual Activity   Alcohol use: Yes   Drug use: No   Sexual activity: Not on file  Other Topics Concern   Not on file  Social History Narrative   Not on file   Social Determinants of Health   Financial Resource Strain: Not on file  Food Insecurity: Not on file  Transportation Needs: Not on file  Physical Activity: Not on file  Stress: Not on file  Social Connections: Not on file  Intimate Partner Violence: Not on file   Health Maintenance  Topic Date Due   COVID-19 Vaccine (1) Never done   Hepatitis C Screening  Never done   Zoster Vaccines- Shingrix (1 of 2) Never done   DEXA SCAN  Never done   Pneumonia Vaccine 32+ Years old (2 - PPSV23 or PCV20) 02/05/2018   COLONOSCOPY (Pts 45-67yrs Insurance coverage will need to be confirmed)  12/02/2018   MAMMOGRAM   09/14/2020   INFLUENZA VACCINE  Completed   HPV VACCINES  Aged Out    The following portions of the patient's history were reviewed and updated as appropriate: allergies, current medications, past family history, past medical history, past social history, past surgical history, and problem list.  Review of Systems Pertinent items noted in HPI and remainder of comprehensive ROS otherwise negative.   Objective:    BP 124/74 (BP Location: Left Arm, Patient Position: Sitting, Cuff Size: Normal)   Pulse 81   Temp 98.1 F (36.7 C) (Oral)   Ht 5\' 5"  (1.651 m)   Wt 101 lb 9.6 oz (46.1 kg)   SpO2 97%   BMI 16.91 kg/m  General appearance: alert, cooperative, and no distress Head: Normocephalic, without obvious abnormality, atraumatic Eyes: conjunctivae/corneas clear. PERRL, EOM's intact. Fundi benign. Ears: normal TM's and external ear canals both ears Nose: Nares normal. Septum midline. Mucosa normal. No drainage or sinus tenderness. Throat: lips, mucosa, and tongue normal; teeth and gums normal Neck: no adenopathy, no carotid bruit, no JVD, supple, symmetrical, trachea midline, and thyroid not enlarged, symmetric, no tenderness/mass/nodules Lungs: clear to auscultation bilaterally Heart: regular rate and rhythm, S1, S2 normal, no murmur, click, rub or gallop Abdomen: soft, non-tender; bowel sounds normal; no masses,  no organomegaly Extremities: TTP of left upper lateral flank and left AC fossa.  extremities normal, atraumatic, no cyanosis or edema Pulses: 2+ and symmetric Skin: Hyperpigmentation on nose and left cheek.  Skin texture, turgor normal. No rashes or lesions Lymph nodes: Cervical, supraclavicular, and axillary nodes normal. Neurologic: Alert and oriented X 3, normal strength and tone. Normal symmetric reflexes. Normal coordination and gait     Assessment:    Healthy female exam.      Plan:    Anticipatory guidance given including wearing seatbelts, smoke detectors in  the home, increasing physical activity, increasing p.o. intake of water and vegetables. -Labs -Immunizations reviewed.  Patient to get pneumonia vaccine at local pharmacy and consider shingles vaccine -Colonoscopy done in 2010.  Patient advised to consider having done again.  Will notify clinic if would like to proceed. -Pap and mammogram done with OB/GYN -Given handout -Next CPE in 1 year See After Visit Summary for Counseling Recommendations  Well adult exam  Melasma  - Plan: Ambulatory referral to Dermatology  Hyperpigmentation of skin  - Plan: Ambulatory referral to Dermatology  Gastroesophageal reflux disease -Stable -Avoid foods known to cause problems  - Plan: omeprazole (PRILOSEC) 40 MG capsule  Essential hypertension  -controlled -Continue current medication including Norvasc 5 mg and lisinopril 40 mg. -Continue lifestyle modifications - Plan: CBC with Differential/Platelet, Basic metabolic panel, Lipid panel, amLODipine (NORVASC) 5 MG tablet, lisinopril (ZESTRIL) 40 MG tablet  Encounter for hepatitis C screening test for low risk patient  - Plan: Hep C Antibody  Asymptomatic postmenopausal state  - Plan: DG Bone Density  Cigarette nicotine dependence without complication -Smoking less than quarter pack per day -Smoking cessation counseling greater than 3 minutes, less than 10 minutes -Continue cutting down -Continue offering quit aids  - Plan: CBC with Differential/Platelet  Tendonitis of elbow, left -2/2 overuse when raking leaves -Supportive care including heat, rest, compression, Tylenol or NSAIDs as needed, topical analgesics  F/u in 4-6 months for HTN and nicotine use   Grier Mitts, MD

## 2022-07-12 LAB — HEPATITIS C ANTIBODY: Hepatitis C Ab: NONREACTIVE

## 2022-09-21 ENCOUNTER — Telehealth (INDEPENDENT_AMBULATORY_CARE_PROVIDER_SITE_OTHER): Payer: BC Managed Care – PPO | Admitting: Family Medicine

## 2022-09-21 ENCOUNTER — Encounter: Payer: Self-pay | Admitting: Family Medicine

## 2022-09-21 DIAGNOSIS — R109 Unspecified abdominal pain: Secondary | ICD-10-CM | POA: Diagnosis not present

## 2022-09-21 DIAGNOSIS — Z87442 Personal history of urinary calculi: Secondary | ICD-10-CM | POA: Diagnosis not present

## 2022-09-21 DIAGNOSIS — Z720 Tobacco use: Secondary | ICD-10-CM

## 2022-09-21 DIAGNOSIS — J069 Acute upper respiratory infection, unspecified: Secondary | ICD-10-CM | POA: Diagnosis not present

## 2022-09-21 MED ORDER — TRAMADOL HCL 50 MG PO TABS: 50.0000 mg | ORAL_TABLET | Freq: Three times a day (TID) | ORAL | 0 refills | Status: AC | PRN

## 2022-09-21 MED ORDER — BENZONATATE 100 MG PO CAPS: 100.0000 mg | ORAL_CAPSULE | Freq: Two times a day (BID) | ORAL | 0 refills | Status: DC | PRN

## 2022-09-21 MED ORDER — TAMSULOSIN HCL 0.4 MG PO CAPS: 0.4000 mg | ORAL_CAPSULE | Freq: Every day | ORAL | 0 refills | Status: DC

## 2022-09-21 MED ORDER — FLUTICASONE PROPIONATE 50 MCG/ACT NA SUSP: 1.0000 | Freq: Every day | NASAL | 1 refills | Status: DC

## 2022-09-21 NOTE — Progress Notes (Signed)
Virtual Visit via Video Note  I connected with Erika Tucker on 09/21/22 at  3:30 PM EST by a video enabled telemedicine application 2/2 ZOXWR-60 pandemic and verified that I am speaking with the correct person using two identifiers.  Location patient: home Location provider:work or home office Persons participating in the virtual visit: patient, provider  I discussed the limitations of evaluation and management by telemedicine and the availability of in person appointments. The patient expressed understanding and agreed to proceed.   HPI: Pt is a 77 yo female with pmh sig for HTN, GERD, renal calculi, tobacco use, allergies who presents  Pt with a productive cough, congestion, rhinorrhea, dry mouth, decreased appetite started 9 days ago.  Denies HA, ST, facial pain/pressure, ear pain/pressure. Tried Mucinex, coricidin.  Pt with R side pain.  Thinks she has a kidney stone.  Started 2 days ago.  Intermittent pain.  Denies hematuria.  Having some nausea. Also with loose stools, 2 x days.  Pt trying to cutting down on smoking.  Went on a trip took to [place] took 5 pks of cigarettes with her and brought 4-1/2 back.    ROS: See pertinent positives and negatives per HPI.  Past Medical History:  Diagnosis Date   GERD (gastroesophageal reflux disease)    Nephrolithiasis    Wrist tendonitis     History reviewed. No pertinent surgical history.  History reviewed. No pertinent family history.    Current Outpatient Medications:    amLODipine (NORVASC) 5 MG tablet, Take 1 tablet (5 mg total) by mouth daily., Disp: 90 tablet, Rfl: 1   benzonatate (TESSALON) 100 MG capsule, Take 1 capsule (100 mg total) by mouth 2 (two) times daily as needed for cough., Disp: 20 capsule, Rfl: 0   brimonidine (ALPHAGAN) 0.2 % ophthalmic solution, 1 drop 2 (two) times daily., Disp: , Rfl:    cetirizine (ZYRTEC) 10 MG tablet, TAKE 1 TABLET BY MOUTH EVERY DAY, Disp: 90 tablet, Rfl: 3   fluticasone (FLONASE) 50 MCG/ACT  nasal spray, Place 1 spray into both nostrils daily., Disp: 16 g, Rfl: 1   latanoprost (XALATAN) 0.005 % ophthalmic solution, Place 1 drop into both eyes at bedtime., Disp: , Rfl:    lisinopril (ZESTRIL) 40 MG tablet, Take 1 tablet (40 mg total) by mouth daily., Disp: 90 tablet, Rfl: 3   nicotine (NICODERM CQ - DOSED IN MG/24 HOURS) 14 mg/24hr patch, PLACE 1 PATCH ONTO THE SKIN DAILY., Disp: 42 patch, Rfl: 1   omeprazole (PRILOSEC) 40 MG capsule, Take 1 capsule (40 mg total) by mouth daily., Disp: 90 capsule, Rfl: 1   tamsulosin (FLOMAX) 0.4 MG CAPS capsule, Take 1 capsule (0.4 mg total) by mouth daily., Disp: 30 capsule, Rfl: 0   timolol (BETIMOL) 0.5 % ophthalmic solution, 1 drop daily., Disp: , Rfl:    traMADol (ULTRAM) 50 MG tablet, Take 1 tablet (50 mg total) by mouth every 8 (eight) hours as needed for up to 5 days., Disp: 15 tablet, Rfl: 0   triamcinolone ointment (KENALOG) 0.5 %, Apply 1 application. topically 2 (two) times daily., Disp: 15 g, Rfl: 0  EXAM:  VITALS per patient if applicable: RR between 45-40 bpm  GENERAL: alert, oriented, appears well and in no acute distress  HEENT: atraumatic, conjunctiva clear, no obvious abnormalities on inspection of external nose and ears  NECK: normal movements of the head and neck  LUNGS: on inspection no signs of respiratory distress, breathing rate appears normal, no obvious gross SOB, gasping or wheezing  CV: no obvious cyanosis  MS: moves all visible extremities without noticeable abnormality  PSYCH/NEURO: pleasant and cooperative, no obvious depression or anxiety, speech and thought processing grossly intact  ASSESSMENT AND PLAN:  Discussed the following assessment and plan:  Acute right flank pain -Concern for renal calculi given patient history.  Also consider right-sided diverticulitis loose stools. -Patient to increase p.o. intake of water and fluids. -Will start Flomax.  Tramadol as needed for pain. -For continued  symptoms obtain stone study and labs.  - Plan: tamsulosin (FLOMAX) 0.4 MG CAPS capsule, traMADol (ULTRAM) 50 MG tablet  Viral URI with cough -Continue supportive care with OTC cough/cold medications, steam from shower, rest, hydration, etc. -Also consider seasonal allergies -Patient encouraged to notify clinic if develops headaches, facial pain/pressure, fever as may be developing sinusitis.  - Plan: fluticasone (FLONASE) 50 MCG/ACT nasal spray, benzonatate (TESSALON) 100 MG capsule  Tobacco use -Patient encouraged to continue cutting down on number of cigarettes smoked per day. -Tried nicotine patches in the past. -Continue to offer years quit aids. -Continue to monitor  History of renal calculi  - Plan: traMADol (ULTRAM) 50 MG tablet  Follow-up as needed for continued or worsening symptoms.   I discussed the assessment and treatment plan with the patient. The patient was provided an opportunity to ask questions and all were answered. The patient agreed with the plan and demonstrated an understanding of the instructions.   The patient was advised to call back or seek an in-person evaluation if the symptoms worsen or if the condition fails to improve as anticipated.  I provided 18 minutes of non-face-to-face time during this encounter.   Billie Ruddy, MD

## 2022-11-17 ENCOUNTER — Other Ambulatory Visit: Payer: Self-pay | Admitting: Family Medicine

## 2022-11-17 DIAGNOSIS — K219 Gastro-esophageal reflux disease without esophagitis: Secondary | ICD-10-CM

## 2023-02-07 ENCOUNTER — Telehealth: Payer: Self-pay | Admitting: Family Medicine

## 2023-02-07 NOTE — Telephone Encounter (Signed)
Spoke with pt, scheduled for in office visit 6/26. Did ask if she could take a benadryl, is not on allergy list, informed her se could take it for the itching.

## 2023-02-07 NOTE — Telephone Encounter (Signed)
Pt has broken out in a rash on her legs back side of body 4 days. Declined OV

## 2023-02-08 ENCOUNTER — Ambulatory Visit: Payer: 59 | Admitting: Family Medicine

## 2023-02-08 ENCOUNTER — Encounter: Payer: Self-pay | Admitting: Family Medicine

## 2023-02-08 VITALS — BP 128/68 | HR 85 | Temp 98.5°F | Wt 106.6 lb

## 2023-02-08 DIAGNOSIS — L309 Dermatitis, unspecified: Secondary | ICD-10-CM | POA: Diagnosis not present

## 2023-02-08 MED ORDER — HYDROXYZINE HCL 25 MG PO TABS: 25.0000 mg | ORAL_TABLET | Freq: Three times a day (TID) | ORAL | 0 refills | Status: AC | PRN

## 2023-02-08 NOTE — Progress Notes (Signed)
   Established Patient Office Visit   Subjective  Patient ID: Erika Tucker, female    DOB: April 07, 1946  Age: 77 y.o. MRN: [id number]  Chief Complaint  Patient presents with   Rash    On right inner thigh, buttocks, sides, right upper arm, and upper chest. Did not take the benadryl. Rash is itching, tried triamcinolone ointment, but still itches,did not take any benadryl. It is red spots     Patient is a 77 year old female who presents for acute concern.  Patient endorses pruritic rash on lower extremities, back, buttocks and abdomen.  First noticed bumps on Saturday, 4 days ago.  Patient tried triamcinolone cream.  Denies changes in foods, soaps, lotions, detergents, foods.  Patient asked about ways to [facility] on face especially nose.  Inquires about chem peels.  Rash      Review of Systems  Skin:  Positive for rash.   Negative unless stated above    Objective:     BP 128/68 (BP Location: Left Arm, Patient Position: Sitting, Cuff Size: Normal)   Pulse 85   Temp 98.5 F (36.9 C) (Oral)   Wt 106 lb 9.6 oz (48.4 kg)   SpO2 96%   BMI 17.74 kg/m  Wt Readings from Last 3 Encounters:  02/08/23 106 lb 9.6 oz (48.4 kg)  07/11/22 101 lb 9.6 oz (46.1 kg)  12/28/21 101 lb 2 oz (45.9 kg)      Physical Exam Constitutional:      Appearance: Normal appearance.  HENT:     Head: Normocephalic and atraumatic.     Nose: Nose normal.     Mouth/Throat:     Mouth: Mucous membranes are moist.  Cardiovascular:     Rate and Rhythm: Normal rate.     Pulses: Normal pulses.     Heart sounds: Normal heart sounds.  Pulmonary:     Effort: Pulmonary effort is normal.     Breath sounds: Normal breath sounds.  Skin:    General: Skin is warm and dry.     Findings: Rash present.     Comments: Scattered erythematous papules 5 mm-7 mm on R upper thigh, b/l buttock, underneath L breast, L lateral upper back, and R lower back undernath bra line.  Neurological:     Mental Status: She is  alert.     No results found for any visits on 02/08/23.    Assessment & Plan:  Dermatitis -     hydrOXYzine HCl; Take 1 tablet (25 mg total) by mouth 3 (three) times daily as needed for itching.  Dispense: 30 tablet; Refill: 0    Return if symptoms worsen or fail to improve.   Deeann Saint, MD

## 2023-02-08 NOTE — Patient Instructions (Signed)
A prescription for hydroxyzine 25 mg was sent to your pharmacy.  This is a pill that helps with itching.  It may cause you to feel weak.  If it does not she can take it at night or try taking half a tab.  If it does not cause drowsiness you can take it up to 3 times a day for itching.

## 2023-02-12 ENCOUNTER — Other Ambulatory Visit: Payer: Self-pay | Admitting: Family Medicine

## 2023-02-12 DIAGNOSIS — R109 Unspecified abdominal pain: Secondary | ICD-10-CM

## 2023-02-12 DIAGNOSIS — I10 Essential (primary) hypertension: Secondary | ICD-10-CM

## 2023-04-19 ENCOUNTER — Other Ambulatory Visit: Payer: Self-pay | Admitting: Family Medicine

## 2023-04-19 DIAGNOSIS — K219 Gastro-esophageal reflux disease without esophagitis: Secondary | ICD-10-CM

## 2023-05-18 ENCOUNTER — Other Ambulatory Visit: Payer: Self-pay | Admitting: Family Medicine

## 2023-05-18 DIAGNOSIS — R109 Unspecified abdominal pain: Secondary | ICD-10-CM

## 2023-05-18 DIAGNOSIS — K219 Gastro-esophageal reflux disease without esophagitis: Secondary | ICD-10-CM

## 2023-05-18 DIAGNOSIS — I10 Essential (primary) hypertension: Secondary | ICD-10-CM

## 2023-06-05 ENCOUNTER — Other Ambulatory Visit: Payer: Self-pay | Admitting: Family Medicine

## 2023-06-05 DIAGNOSIS — I10 Essential (primary) hypertension: Secondary | ICD-10-CM

## 2023-08-05 ENCOUNTER — Other Ambulatory Visit: Payer: Self-pay | Admitting: Family Medicine

## 2023-08-05 DIAGNOSIS — R109 Unspecified abdominal pain: Secondary | ICD-10-CM

## 2023-08-07 ENCOUNTER — Other Ambulatory Visit: Payer: Self-pay | Admitting: Family Medicine

## 2023-08-07 DIAGNOSIS — I10 Essential (primary) hypertension: Secondary | ICD-10-CM

## 2023-08-07 DIAGNOSIS — K219 Gastro-esophageal reflux disease without esophagitis: Secondary | ICD-10-CM

## 2023-08-07 MED ORDER — AMLODIPINE BESYLATE 5 MG PO TABS: 5.0000 mg | ORAL_TABLET | Freq: Every day | ORAL | 1 refills | Status: DC

## 2023-08-07 MED ORDER — LISINOPRIL 40 MG PO TABS: 40.0000 mg | ORAL_TABLET | Freq: Every day | ORAL | 1 refills | Status: DC

## 2023-08-07 MED ORDER — OMEPRAZOLE 40 MG PO CPDR: 40.0000 mg | DELAYED_RELEASE_CAPSULE | Freq: Every day | ORAL | 1 refills | Status: DC

## 2023-08-07 NOTE — Telephone Encounter (Signed)
Copied from CRM [id number]. Topic: Clinical - Medication Refill >> Aug 07, 2023 11:39 AM Corin V wrote: Most Recent Primary Care Visit:  Provider: Deeann Saint  Department: [facility]  Visit Type: OFFICE VISIT  Date: 02/08/2023  Medication: lisinopril (ZESTRIL) 40 MG tablet amLODipine (NORVASC) 5 MG tablet  Has the patient contacted their pharmacy? Yes (Agent: If no, request that the patient contact the pharmacy for the refill. If patient does not wish to contact the pharmacy document the reason why and proceed with request.) (Agent: If yes, when and what did the pharmacy advise?)  Is this the correct pharmacy for this prescription?  If no, delete pharmacy and type the correct one.  This is the patient's preferred pharmacy:  [phone] [place], [place] - [address] [place] [address] Phone: [phone] Fax: [phone]   Has the prescription been filled recently?   Is the patient out of the medication?   Has the patient been seen for an appointment in the last year OR does the patient have an upcoming appointment?   Can we respond through MyChart?   Agent: Please be advised that Rx refills may take up to 3 business days. We ask that you follow-up with your pharmacy.

## 2023-08-28 ENCOUNTER — Other Ambulatory Visit: Payer: Self-pay | Admitting: Family Medicine

## 2023-08-28 DIAGNOSIS — R109 Unspecified abdominal pain: Secondary | ICD-10-CM

## 2024-02-23 ENCOUNTER — Ambulatory Visit: Admitting: Family Medicine

## 2024-02-23 ENCOUNTER — Encounter: Payer: Self-pay | Admitting: Family Medicine

## 2024-02-23 VITALS — BP 116/72 | HR 89 | Temp 98.3°F | Ht 65.0 in | Wt 103.8 lb

## 2024-02-23 DIAGNOSIS — T63421A Toxic effect of venom of ants, accidental (unintentional), initial encounter: Secondary | ICD-10-CM | POA: Diagnosis not present

## 2024-02-23 DIAGNOSIS — L720 Epidermal cyst: Secondary | ICD-10-CM | POA: Diagnosis not present

## 2024-02-23 DIAGNOSIS — L309 Dermatitis, unspecified: Secondary | ICD-10-CM | POA: Diagnosis not present

## 2024-02-23 MED ORDER — TRIAMCINOLONE ACETONIDE 0.1 % EX CREA: 1.0000 | TOPICAL_CREAM | Freq: Two times a day (BID) | CUTANEOUS | 0 refills | Status: DC

## 2024-02-23 NOTE — Progress Notes (Signed)
 Established Patient Office Visit   Subjective  Patient ID: Erika Tucker, female    DOB: 01-26-1946  Age: 78 y.o. MRN: [id number]  Chief Complaint  Patient presents with   Medical Management of Chronic Issues    Left foot bug bite, top of the foot, started a week ago, patient is having swelling, itching and bumps,     Pt is a 78 year old female seen for acute concern.  Patient endorses a pruritic bump on left foot.  Cause swelling on top of foot.  Possibly a bug bite.  Present x 1 week.  Patient also mentions a bump on left lateral abdomen present x a while that has turned dark in color.    Patient Active Problem List   Diagnosis Date Noted   Environmental and seasonal allergies 12/28/2021   Essential hypertension 12/18/2017   Routine general medical examination at a health care facility 12/11/2017   Kidney stone on right side 05/28/2012   TOBACCO USE 10/23/2009   GERD 10/23/2009   NEPHROLITHIASIS, HX OF 10/23/2009   Past Medical History:  Diagnosis Date   GERD (gastroesophageal reflux disease)    Nephrolithiasis    Wrist tendonitis    History reviewed. No pertinent surgical history. Social History   Tobacco Use   Smoking status: Every Day    Current packs/day: 0.25    Types: Cigarettes   Smokeless tobacco: Never  Substance Use Topics   Alcohol use: Yes   Drug use: No   History reviewed. No pertinent family history. Allergies  Allergen Reactions   Aspirin     REACTION: GI Intolerance    ROS Negative unless stated above    Objective:     BP 116/72 (BP Location: Left Arm, Patient Position: Sitting, Cuff Size: Normal)   Pulse 89   Temp 98.3 F (36.8 C) (Oral)   Ht 5' 5 (1.651 m)   Wt 103 lb 12.8 oz (47.1 kg)   SpO2 94%   BMI 17.27 kg/m  BP Readings from Last 3 Encounters:  02/23/24 116/72  02/08/23 128/68  07/11/22 124/74   Wt Readings from Last 3 Encounters:  02/23/24 103 lb 12.8 oz (47.1 kg)  02/08/23 106 lb 9.6 oz (48.4 kg)  07/11/22 101  lb 9.6 oz (46.1 kg)      Physical Exam Constitutional:      General: She is not in acute distress.    Appearance: Normal appearance.  HENT:     Head: Normocephalic and atraumatic.     Nose: Nose normal.     Mouth/Throat:     Mouth: Mucous membranes are moist.  Cardiovascular:     Rate and Rhythm: Normal rate and regular rhythm.     Heart sounds: Normal heart sounds. No murmur heard.    No gallop.  Pulmonary:     Effort: Pulmonary effort is normal. No respiratory distress.     Breath sounds: Normal breath sounds. No wheezing, rhonchi or rales.  Skin:    General: Skin is warm and dry.     Comments: Dorsum of left foot with 3-4 small papules and mild erythema.  No edema or induration noted.  Left lateral lower abdomen with an open comedone.  Thick, hard black debris expressed with portions of cyst sac removed with tweezers.  Neurological:     Mental Status: She is alert and oriented to person, place, and time.        02/23/2024    4:36 PM 02/08/2023    2:53 PM 07/11/2022  8:40 AM  Depression screen PHQ 2/9  Decreased Interest 0 0 0  Down, Depressed, Hopeless 0 0 0  PHQ - 2 Score 0 0 0  Altered sleeping 0 0 0  Tired, decreased energy 0 0 0  Change in appetite 0 0 0  Feeling bad or failure about yourself  0 0 0  Trouble concentrating 0 0 0  Moving slowly or fidgety/restless 0 0 0  Suicidal thoughts 0 0 0  PHQ-9 Score 0 0 0  Difficult doing work/chores Not difficult at all        02/23/2024    4:39 PM 02/08/2023    2:53 PM  GAD 7 : Generalized Anxiety Score  Nervous, Anxious, on Edge 0 0  Control/stop worrying 0 0  Worry too much - different things 0 0  Trouble relaxing 0 0  Restless 0 0  Easily annoyed or irritable 0 0  Afraid - awful might happen 0 0  Total GAD 7 Score 0 0  Anxiety Difficulty Not difficult at all      No results found for any visits on 02/23/24.    Assessment & Plan:   Fire ant bite, accidental or unintentional, initial  encounter  Dermatitis -     Triamcinolone  Acetonide; Apply 1 Application topically 2 (two) times daily.  Dispense: 15 g; Refill: 0  Epidermoid cyst  Patient with acute dermatitis of left foot.  Papules consistent with possible fire ant bite.  Discussed supportive care including topical cortisone, p.o. antihistamine.  Advised to avoid scratching to prevent secondary infection..  Can also use cool compresses.  Ruptured epidermoid cyst of left lower abdomen.  Consent obtained.  Debris removed using expression and tweezers.  Patient tolerated procedure well.  Return if symptoms worsen or fail to improve.   Clotilda JONELLE Single, MD

## 2024-03-28 ENCOUNTER — Other Ambulatory Visit: Payer: Self-pay | Admitting: Family Medicine

## 2024-03-28 DIAGNOSIS — I10 Essential (primary) hypertension: Secondary | ICD-10-CM

## 2024-04-10 ENCOUNTER — Encounter: Admitting: Family Medicine

## 2024-04-10 DIAGNOSIS — K219 Gastro-esophageal reflux disease without esophagitis: Secondary | ICD-10-CM

## 2024-04-10 DIAGNOSIS — I10 Essential (primary) hypertension: Secondary | ICD-10-CM

## 2024-04-10 DIAGNOSIS — Z0000 Encounter for general adult medical examination without abnormal findings: Secondary | ICD-10-CM

## 2024-04-14 ENCOUNTER — Other Ambulatory Visit: Payer: Self-pay | Admitting: Family Medicine

## 2024-04-14 DIAGNOSIS — K219 Gastro-esophageal reflux disease without esophagitis: Secondary | ICD-10-CM

## 2024-04-25 ENCOUNTER — Other Ambulatory Visit: Payer: Self-pay | Admitting: Family Medicine

## 2024-04-25 DIAGNOSIS — I10 Essential (primary) hypertension: Secondary | ICD-10-CM

## 2024-04-26 ENCOUNTER — Encounter: Admitting: Family Medicine

## 2024-04-27 ENCOUNTER — Other Ambulatory Visit: Payer: Self-pay | Admitting: Family Medicine

## 2024-04-27 DIAGNOSIS — I10 Essential (primary) hypertension: Secondary | ICD-10-CM

## 2024-05-01 ENCOUNTER — Other Ambulatory Visit: Payer: Self-pay | Admitting: Family Medicine

## 2024-05-01 DIAGNOSIS — I10 Essential (primary) hypertension: Secondary | ICD-10-CM

## 2024-05-01 DIAGNOSIS — L309 Dermatitis, unspecified: Secondary | ICD-10-CM

## 2024-05-01 DIAGNOSIS — K219 Gastro-esophageal reflux disease without esophagitis: Secondary | ICD-10-CM

## 2024-05-15 ENCOUNTER — Other Ambulatory Visit: Payer: Self-pay | Admitting: Family Medicine

## 2024-05-15 DIAGNOSIS — L309 Dermatitis, unspecified: Secondary | ICD-10-CM

## 2024-05-17 ENCOUNTER — Ambulatory Visit (INDEPENDENT_AMBULATORY_CARE_PROVIDER_SITE_OTHER): Admitting: Family Medicine

## 2024-05-17 ENCOUNTER — Encounter: Payer: Self-pay | Admitting: Family Medicine

## 2024-05-17 VITALS — BP 128/64 | HR 64 | Temp 97.9°F | Ht 65.0 in | Wt 103.8 lb

## 2024-05-17 DIAGNOSIS — Z87898 Personal history of other specified conditions: Secondary | ICD-10-CM

## 2024-05-17 DIAGNOSIS — Z720 Tobacco use: Secondary | ICD-10-CM

## 2024-05-17 DIAGNOSIS — Z23 Encounter for immunization: Secondary | ICD-10-CM

## 2024-05-17 DIAGNOSIS — K219 Gastro-esophageal reflux disease without esophagitis: Secondary | ICD-10-CM | POA: Diagnosis not present

## 2024-05-17 DIAGNOSIS — I10 Essential (primary) hypertension: Secondary | ICD-10-CM | POA: Diagnosis not present

## 2024-05-17 DIAGNOSIS — Z0000 Encounter for general adult medical examination without abnormal findings: Secondary | ICD-10-CM | POA: Diagnosis not present

## 2024-05-17 MED ORDER — AMLODIPINE BESYLATE 5 MG PO TABS: 5.0000 mg | ORAL_TABLET | Freq: Every day | ORAL | 1 refills | Status: AC

## 2024-05-17 MED ORDER — LISINOPRIL 40 MG PO TABS: 40.0000 mg | ORAL_TABLET | Freq: Every day | ORAL | 0 refills | Status: DC

## 2024-05-17 MED ORDER — OMEPRAZOLE 40 MG PO CPDR: 40.0000 mg | DELAYED_RELEASE_CAPSULE | Freq: Every day | ORAL | 0 refills | Status: DC

## 2024-05-17 NOTE — Progress Notes (Signed)
 Established Patient Office Visit   Subjective  Patient ID: Erika Tucker, female    DOB: September 07, 1945  Age: 78 y.o. MRN: [id number]  Chief Complaint  Patient presents with   Annual Exam    Patient is a 78 year old female seen for CPE.  Patient states she is doing well overall.  Requesting refills on all medications.  Interested in influenza vaccine.  States appetite is good but having difficulty gaining weight.  States given a medication several yrs ago that helped her gain wt, but does not recall the name.     Patient Active Problem List   Diagnosis Date Noted   Environmental and seasonal allergies 12/28/2021   Essential hypertension 12/18/2017   Routine general medical examination at a health care facility 12/11/2017   Kidney stone on right side 05/28/2012   TOBACCO USE 10/23/2009   GERD 10/23/2009   NEPHROLITHIASIS, HX OF 10/23/2009   Past Medical History:  Diagnosis Date   GERD (gastroesophageal reflux disease)    Nephrolithiasis    Wrist tendonitis    History reviewed. No pertinent surgical history. Social History   Tobacco Use   Smoking status: Every Day    Current packs/day: 0.25    Types: Cigarettes   Smokeless tobacco: Never  Substance Use Topics   Alcohol use: Yes   Drug use: No   History reviewed. No pertinent family history. Allergies  Allergen Reactions   Aspirin     REACTION: GI Intolerance    ROS Negative unless stated above    Objective:     BP 128/64 (BP Location: Left Arm, Patient Position: Sitting, Cuff Size: Normal)   Pulse 64   Temp 97.9 F (36.6 C) (Oral)   Ht 5' 5 (1.651 m)   Wt 103 lb 12.8 oz (47.1 kg)   SpO2 99%   BMI 17.27 kg/m  BP Readings from Last 3 Encounters:  05/17/24 128/64  02/23/24 116/72  02/08/23 128/68   Wt Readings from Last 3 Encounters:  05/17/24 103 lb 12.8 oz (47.1 kg)  02/23/24 103 lb 12.8 oz (47.1 kg)  02/08/23 106 lb 9.6 oz (48.4 kg)      Physical Exam Constitutional:      Appearance: Normal  appearance.  HENT:     Head: Normocephalic and atraumatic.     Right Ear: Tympanic membrane, ear canal and external ear normal.     Left Ear: Tympanic membrane, ear canal and external ear normal.     Nose: Nose normal.     Mouth/Throat:     Mouth: Mucous membranes are moist.     Pharynx: No oropharyngeal exudate or posterior oropharyngeal erythema.  Eyes:     General: No scleral icterus.    Extraocular Movements: Extraocular movements intact.     Conjunctiva/sclera: Conjunctivae normal.     Pupils: Pupils are equal, round, and reactive to light.  Neck:     Thyroid : No thyromegaly.     Vascular: No carotid bruit.  Cardiovascular:     Rate and Rhythm: Normal rate and regular rhythm.     Pulses: Normal pulses.     Heart sounds: Normal heart sounds. No murmur heard.    No friction rub.  Pulmonary:     Effort: Pulmonary effort is normal.     Breath sounds: Normal breath sounds. No wheezing, rhonchi or rales.  Abdominal:     General: Bowel sounds are normal.     Palpations: Abdomen is soft.     Tenderness: There is no abdominal  tenderness.  Musculoskeletal:        General: No deformity. Normal range of motion.  Lymphadenopathy:     Cervical: No cervical adenopathy.  Skin:    General: Skin is warm and dry.     Findings: No lesion.  Neurological:     General: No focal deficit present.     Mental Status: She is alert and oriented to person, place, and time.  Psychiatric:        Mood and Affect: Mood normal.        Thought Content: Thought content normal.        05/17/2024   10:25 AM 02/23/2024    4:36 PM 02/08/2023    2:53 PM  Depression screen PHQ 2/9  Decreased Interest 0 0 0  Down, Depressed, Hopeless 0 0 0  PHQ - 2 Score 0 0 0  Altered sleeping 0 0 0  Tired, decreased energy 0 0 0  Change in appetite 0 0 0  Feeling bad or failure about yourself  0 0 0  Trouble concentrating 0 0 0  Moving slowly or fidgety/restless 0 0 0  Suicidal thoughts 0 0 0  PHQ-9 Score 0 0 0   Difficult doing work/chores  Not difficult at all       05/17/2024   10:25 AM 02/23/2024    4:39 PM 02/08/2023    2:53 PM  GAD 7 : Generalized Anxiety Score  Nervous, Anxious, on Edge 0 0 0  Control/stop worrying 0 0 0  Worry too much - different things 0 0 0  Trouble relaxing 0 0 0  Restless 0 0 0  Easily annoyed or irritable 0 0 0  Afraid - awful might happen 0 0 0  Total GAD 7 Score 0 0 0  Anxiety Difficulty  Not difficult at all      No results found for any visits on 05/17/24.    Assessment & Plan:   Well adult exam -     CBC with Differential/Platelet; Future -     Comprehensive metabolic panel with GFR; Future -     Hemoglobin A1c; Future -     Lipid panel; Future -     T4, free; Future -     TSH; Future  Essential hypertension -     Comprehensive metabolic panel with GFR; Future -     T4, free; Future -     TSH; Future -     amLODIPine  Besylate; Take 1 tablet (5 mg total) by mouth daily.  Dispense: 90 tablet; Refill: 1 -     Lisinopril ; Take 1 tablet (40 mg total) by mouth daily.  Dispense: 30 tablet; Refill: 0  Need for influenza vaccination -     Flu vaccine HIGH DOSE PF(Fluzone Trivalent)  Gastroesophageal reflux disease, unspecified whether esophagitis present -     Vitamin B12; Future -     Omeprazole ; Take 1 capsule (40 mg total) by mouth daily.  Dispense: 30 capsule; Refill: 0  History of weight loss -     CBC with Differential/Platelet; Future -     Comprehensive metabolic panel with GFR; Future -     Hemoglobin A1c; Future -     T4, free; Future -     TSH; Future  Tobacco use  Age appropriate health screenings discussed.  Obtain labs.  Immunizations reviewed.  Influenza vaccine given this visit.  Bone density scan advised.  Pap, colonoscopy, and mammogram not indicated 2/2 age.  BP well controlled.  Continue Norvasc  5 mg daily and lisinopril  40 mg daily.  Smoking cessation encouraged.  Continue lifestyle modifications.  Omeprazole   refilled.  Return if symptoms worsen or fail to improve.   Clotilda JONELLE Single, MD

## 2024-05-18 LAB — COMPREHENSIVE METABOLIC PANEL WITH GFR
AG Ratio: 1.8 (calc) (ref 1.0–2.5)
ALT: 7 U/L (ref 6–29)
AST: 13 U/L (ref 10–35)
Albumin: 4.4 g/dL (ref 3.6–5.1)
Alkaline phosphatase (APISO): 94 U/L (ref 37–153)
BUN: 15 mg/dL (ref 7–25)
CO2: 25 mmol/L (ref 20–32)
Calcium: 9.2 mg/dL (ref 8.6–10.4)
Chloride: 109 mmol/L (ref 98–110)
Creat: 0.96 mg/dL (ref 0.60–1.00)
Globulin: 2.4 g/dL (ref 1.9–3.7)
Glucose, Bld: 94 mg/dL (ref 65–99)
Potassium: 4.2 mmol/L (ref 3.5–5.3)
Sodium: 142 mmol/L (ref 135–146)
Total Bilirubin: 0.4 mg/dL (ref 0.2–1.2)
Total Protein: 6.8 g/dL (ref 6.1–8.1)
eGFR: 61 mL/min/1.73m2 (ref >=60)

## 2024-05-18 LAB — CBC WITH DIFFERENTIAL/PLATELET
Absolute Lymphocytes: 2876 {cells}/uL (ref 850–3900)
Absolute Monocytes: 518 {cells}/uL (ref 200–950)
Basophils Absolute: 50 {cells}/uL (ref 0–200)
Basophils Relative: 0.7 %
Eosinophils Absolute: 263 {cells}/uL (ref 15–500)
Eosinophils Relative: 3.7 %
HCT: 34.7 % — ABNORMAL LOW (ref 35.0–45.0)
Hemoglobin: 11.4 g/dL — ABNORMAL LOW (ref 11.7–15.5)
MCH: 32.6 pg (ref 27.0–33.0)
MCHC: 32.9 g/dL (ref 32.0–36.0)
MCV: 99.1 fL (ref 80.0–100.0)
MPV: 10.8 fL (ref 7.5–12.5)
Monocytes Relative: 7.3 %
Neutro Abs: 3394 {cells}/uL (ref 1500–7800)
Neutrophils Relative %: 47.8 %
Platelets: 270 Thousand/uL (ref 140–400)
RBC: 3.5 Million/uL — ABNORMAL LOW (ref 3.80–5.10)
RDW: 13.3 % (ref 11.0–15.0)
Total Lymphocyte: 40.5 %
WBC: 7.1 Thousand/uL (ref 3.8–10.8)

## 2024-05-18 LAB — HEMOGLOBIN A1C
Hgb A1c MFr Bld: 5.8 % — ABNORMAL HIGH (ref <5.7)
Mean Plasma Glucose: 120 mg/dL
eAG (mmol/L): 6.6 mmol/L

## 2024-05-18 LAB — LIPID PANEL
Cholesterol: 173 mg/dL (ref <200)
HDL: 61 mg/dL (ref >=50)
LDL Cholesterol (Calc): 95 mg/dL
Non-HDL Cholesterol (Calc): 112 mg/dL (ref <130)
Total CHOL/HDL Ratio: 2.8 (calc) (ref <5.0)
Triglycerides: 77 mg/dL (ref <150)

## 2024-05-18 LAB — TSH: TSH: 1.1 m[IU]/L (ref 0.40–4.50)

## 2024-05-18 LAB — T4, FREE: Free T4: 1.2 ng/dL (ref 0.8–1.8)

## 2024-05-18 LAB — VITAMIN B12: Vitamin B-12: 428 pg/mL (ref 200–1100)

## 2024-05-22 ENCOUNTER — Ambulatory Visit: Payer: Self-pay | Admitting: Family Medicine

## 2024-06-07 ENCOUNTER — Other Ambulatory Visit: Payer: Self-pay | Admitting: Family Medicine

## 2024-06-07 DIAGNOSIS — K219 Gastro-esophageal reflux disease without esophagitis: Secondary | ICD-10-CM

## 2024-06-25 ENCOUNTER — Other Ambulatory Visit: Payer: Self-pay | Admitting: Family Medicine

## 2024-06-25 DIAGNOSIS — L309 Dermatitis, unspecified: Secondary | ICD-10-CM

## 2024-06-25 DIAGNOSIS — I10 Essential (primary) hypertension: Secondary | ICD-10-CM

## 2024-08-26 ENCOUNTER — Ambulatory Visit: Payer: Self-pay

## 2024-08-26 NOTE — Telephone Encounter (Signed)
 FYI Only or Action Required?: Action required by provider: patient is requesting medication for symptoms .  Patient was last seen in primary care on 05/17/2024 by Mercer Clotilda SAUNDERS, MD.  Called Nurse Triage reporting Facial Pain.  Symptoms began last week .  Interventions attempted: OTC medications: tylenol   and clarain .  Symptoms are: stable.  Triage Disposition: See HCP Within 4 Hours (Or PCP Triage)  Patient/caregiver understands and will follow disposition?: Yes           Reason for Disposition  [1] SEVERE sinus pain (e.g., excruciating) AND [2] not improved 2 hours after pain medicine  Answer Assessment - Initial Assessment Questions Patient reports symptoms started last  week headache runny nose eyes running, mainly headache . Main symptom headache. Around eyes nose forehead , sinus pain. No redness swelling to face. Pain 9/10 when lies down. 3-4/10 now. Able to touch chin to chest. Taking clartin , tylenol  yesterday nothing this morning. Patient not able to come in for an office visit due to work and is requesting medication to pharmacy on file if not effective will come in to be seen and if worsening will seek UC. Sending to office patient requesting medication for symptoms    1. LOCATION: Where does it hurt?      Forehead sides of head around head eyes  2. ONSET: When did the sinus pain start?  (e.g., hours, days)      Started last week  3. SEVERITY: How bad is the pain?   (Scale 0-10; or none, mild, moderate or severe)     Severe at night and mild to moderate  during the day  4. RECURRENT SYMPTOM: Have you ever had sinus problems before? If Yes, ask: When was the last time? and What happened that time?      Yes every year the same time of year  5. NASAL CONGESTION: Is the nose blocked? If Yes, ask: Can you open it or must you breathe through your mouth?     Yes  6. NASAL DISCHARGE: Do you have discharge from your nose? If so ask, What color?      Yes white 7. FEVER: Do you have a fever? If Yes, ask: What is it, how was it measured, and when did it start?      Denies  8. OTHER SYMPTOMS: Do you have any other symptoms? (e.g., sore throat, cough, earache, difficulty breathing)     Runny nose, eyes watery , headaches, sinus pain    Patient denies  the following chest pain , shortness of breath, fever , cough  Protocols used: Sinus Pain or Congestion-A-AH Copied from CRM #[id number]. Topic: Clinical - Red Word Triage >> Aug 26, 2024 10:23 AM Wess RAMAN wrote: Red Word that prompted transfer to Nurse Triage: Sinus headache since last week. Tylenol  not working. Eyes and nose both are running.  Pharmacy: [id number] [facility], [place] - [address] [place]  [address] Phone: [phone] Fax: [phone] Hours: Not open 24 hours

## 2024-08-26 NOTE — Telephone Encounter (Signed)
"  Appt 11/4.  "

## 2024-08-28 ENCOUNTER — Encounter: Payer: Self-pay | Admitting: Family Medicine

## 2024-08-28 ENCOUNTER — Ambulatory Visit: Admitting: Family Medicine

## 2024-08-28 VITALS — BP 130/72 | HR 85 | Temp 97.9°F | Ht 65.0 in | Wt 103.2 lb

## 2024-08-28 DIAGNOSIS — J0140 Acute pansinusitis, unspecified: Secondary | ICD-10-CM

## 2024-08-28 MED ORDER — FLUTICASONE PROPIONATE 50 MCG/ACT NA SUSP: 1.0000 | Freq: Every day | NASAL | 1 refills | Status: AC

## 2024-08-28 MED ORDER — AMOXICILLIN-POT CLAVULANATE 500-125 MG PO TABS: 1.0000 | ORAL_TABLET | Freq: Two times a day (BID) | ORAL | 0 refills | Status: AC

## 2024-08-28 NOTE — Progress Notes (Signed)
 "  Established Patient Office Visit   Subjective  Patient ID: Erika Tucker, female    DOB: 05/03/1946  Age: 79 y.o. MRN: [id number]  Chief Complaint  Patient presents with   Acute Visit    Patient came in today for Headaches face pain sinus pressure, runny nose, cough,  started a week ago,     Patient is a 79 year old female seen for acute concern.  Patient endorses headache, facial pain, rhinorrhea, sinus pressure/pain, watery eyes x 1 week.  Also notes occasional cough.  Denies ear pain/pressure, sore throat.  Tried Claritin and Tylenol  for symptoms.    Patient Active Problem List   Diagnosis Date Noted   Environmental and seasonal allergies 12/28/2021   Essential hypertension 12/18/2017   Routine general medical examination at a health care facility 12/11/2017   Kidney stone on right side 05/28/2012   TOBACCO USE 10/23/2009   GERD 10/23/2009   NEPHROLITHIASIS, HX OF 10/23/2009   Past Medical History:  Diagnosis Date   GERD (gastroesophageal reflux disease)    Nephrolithiasis    Wrist tendonitis    History reviewed. No pertinent surgical history. Social History[1] History reviewed. No pertinent family history. Allergies[2]  ROS Negative unless stated above    Objective:     BP 130/72 (BP Location: Left Arm, Patient Position: Sitting, Cuff Size: Normal)   Pulse 85   Temp 97.9 F (36.6 C) (Oral)   Ht 5' 5 (1.651 m)   Wt 103 lb 3.2 oz (46.8 kg)   SpO2 99%   BMI 17.17 kg/m  BP Readings from Last 3 Encounters:  08/28/24 130/72  05/17/24 128/64  02/23/24 116/72   Wt Readings from Last 3 Encounters:  08/28/24 103 lb 3.2 oz (46.8 kg)  05/17/24 103 lb 12.8 oz (47.1 kg)  02/23/24 103 lb 12.8 oz (47.1 kg)      Physical Exam Constitutional:      General: She is not in acute distress.    Appearance: Normal appearance.  HENT:     Head: Normocephalic and atraumatic.     Right Ear: Hearing and tympanic membrane normal.     Left Ear: Hearing and tympanic  membrane normal.     Nose:     Right Sinus: Maxillary sinus tenderness present.     Left Sinus: Maxillary sinus tenderness and frontal sinus tenderness present.     Mouth/Throat:     Mouth: Mucous membranes are moist.  Cardiovascular:     Rate and Rhythm: Normal rate and regular rhythm.     Heart sounds: Normal heart sounds. No murmur heard.    No gallop.  Pulmonary:     Effort: Pulmonary effort is normal. No respiratory distress.     Breath sounds: Normal breath sounds. No wheezing, rhonchi or rales.  Skin:    General: Skin is warm and dry.  Neurological:     Mental Status: She is alert and oriented to person, place, and time.        08/28/2024    5:02 PM 05/17/2024   10:25 AM 02/23/2024    4:36 PM  Depression screen PHQ 2/9  Decreased Interest 0 0 0  Down, Depressed, Hopeless 0 0 0  PHQ - 2 Score 0 0 0  Altered sleeping 0 0 0  Tired, decreased energy 0 0 0  Change in appetite 0 0 0  Feeling bad or failure about yourself  0 0 0  Trouble concentrating 0 0 0  Moving slowly or fidgety/restless 0 0 0  Suicidal thoughts 0 0 0  PHQ-9 Score 0 0  0   Difficult doing work/chores Not difficult at all  Not difficult at all     Data saved with a previous flowsheet row definition      08/28/2024    5:02 PM 05/17/2024   10:25 AM 02/23/2024    4:39 PM 02/08/2023    2:53 PM  GAD 7 : Generalized Anxiety Score  Nervous, Anxious, on Edge 0 0 0 0  Control/stop worrying 0 0 0 0  Worry too much - different things 0 0 0 0  Trouble relaxing 0 0 0 0  Restless 0 0 0 0  Easily annoyed or irritable 0 0 0 0  Afraid - awful might happen 0 0 0 0  Total GAD 7 Score 0 0 0 0  Anxiety Difficulty Not difficult at all  Not difficult at all      No results found for any visits on 08/28/24.    Assessment & Plan:   Acute pansinusitis, recurrence not specified -     Amoxicillin -Pot Clavulanate; Take 1 tablet by mouth in the morning and at bedtime for 7 days.  Dispense: 14 tablet; Refill: 0 -      Fluticasone  Propionate; Place 1 spray into both nostrils daily.  Dispense: 16 g; Refill: 1  Acute pansinusitis.  Start ABX.  Consider restarting saline nasal rinse or Flonase  nasal spray as needed.  Continue supportive care with Tylenol , steam from shower, rest, hydration.  Follow-up as needed for continued/worsening symptoms.  Return if symptoms worsen or fail to improve.   Clotilda JONELLE Single, MD    [1]  Social History Tobacco Use   Smoking status: Every Day    Current packs/day: 0.25    Types: Cigarettes   Smokeless tobacco: Never  Substance Use Topics   Alcohol use: Yes   Drug use: No  [2]  Allergies Allergen Reactions   Aspirin     REACTION: GI Intolerance   "
